# Patient Record
Sex: Female | Born: 1975
Health system: Southern US, Community
[De-identification: ages and names within clinical notes are randomized; demographics above are authoritative.]

## PROBLEM LIST (undated history)

## (undated) DIAGNOSIS — E669 Obesity, unspecified: Secondary | ICD-10-CM

## (undated) DIAGNOSIS — I10 Essential (primary) hypertension: Secondary | ICD-10-CM

## (undated) DIAGNOSIS — O24419 Gestational diabetes mellitus in pregnancy, unspecified control: Secondary | ICD-10-CM

## (undated) DIAGNOSIS — M5030 Other cervical disc degeneration, unspecified cervical region: Secondary | ICD-10-CM

## (undated) DIAGNOSIS — K219 Gastro-esophageal reflux disease without esophagitis: Secondary | ICD-10-CM

## (undated) DIAGNOSIS — I201 Angina pectoris with documented spasm: Secondary | ICD-10-CM

## (undated) DIAGNOSIS — Z6839 Body mass index (BMI) 39.0-39.9, adult: Secondary | ICD-10-CM

## (undated) DIAGNOSIS — R000 Tachycardia, unspecified: Secondary | ICD-10-CM

## (undated) DIAGNOSIS — R002 Palpitations: Secondary | ICD-10-CM

## (undated) DIAGNOSIS — G35D Multiple sclerosis, unspecified: Secondary | ICD-10-CM

## (undated) DIAGNOSIS — G35 Multiple sclerosis: Secondary | ICD-10-CM

## (undated) HISTORY — DX: Essential (primary) hypertension: I10

## (undated) HISTORY — DX: Multiple sclerosis, unspecified: G35.D

## (undated) HISTORY — PX: TUBAL LIGATION: SHX77

## (undated) HISTORY — DX: Other cervical disc degeneration, unspecified cervical region: M50.30

## (undated) HISTORY — DX: Body mass index (BMI) 39.0-39.9, adult: Z68.39

## (undated) HISTORY — DX: Gastro-esophageal reflux disease without esophagitis: K21.9

## (undated) HISTORY — DX: Palpitations: R00.2

## (undated) HISTORY — DX: Angina pectoris with documented spasm: I20.1

## (undated) HISTORY — DX: Multiple sclerosis: G35

## (undated) HISTORY — DX: Gestational diabetes mellitus in pregnancy, unspecified control: O24.419

## (undated) HISTORY — DX: Tachycardia, unspecified: R00.0

## (undated) HISTORY — PX: CHOLECYSTECTOMY: SHX55

---

## 2015-07-01 ENCOUNTER — Emergency Department (HOSPITAL_BASED_OUTPATIENT_CLINIC_OR_DEPARTMENT_OTHER)
Admission: EM | Admit: 2015-07-01 | Discharge: 2015-07-01 | Disposition: A | Discharge status: Home / Self Care | Payer: Commercial Managed Care - PPO | Attending: Emergency Medicine | Admitting: Emergency Medicine

## 2015-07-01 ENCOUNTER — Emergency Department (HOSPITAL_BASED_OUTPATIENT_CLINIC_OR_DEPARTMENT_OTHER): Payer: Commercial Managed Care - PPO

## 2015-07-01 ENCOUNTER — Encounter (HOSPITAL_BASED_OUTPATIENT_CLINIC_OR_DEPARTMENT_OTHER): Payer: Self-pay | Admitting: Emergency Medicine

## 2015-07-01 DIAGNOSIS — J159 Unspecified bacterial pneumonia: Secondary | ICD-10-CM | POA: Insufficient documentation

## 2015-07-01 DIAGNOSIS — J189 Pneumonia, unspecified organism: Secondary | ICD-10-CM

## 2015-07-01 DIAGNOSIS — Z3202 Encounter for pregnancy test, result negative: Secondary | ICD-10-CM | POA: Diagnosis not present

## 2015-07-01 DIAGNOSIS — R05 Cough: Secondary | ICD-10-CM | POA: Diagnosis present

## 2015-07-01 LAB — URINALYSIS, ROUTINE W REFLEX MICROSCOPIC
BILIRUBIN URINE: NEGATIVE
GLUCOSE, UA: NEGATIVE mg/dL
KETONES UR: NEGATIVE mg/dL
LEUKOCYTES UA: NEGATIVE
NITRITE: NEGATIVE
PH: 5.5 (ref 5.0–8.0)
Protein, ur: NEGATIVE mg/dL
SPECIFIC GRAVITY, URINE: 1.03 (ref 1.005–1.030)
Urobilinogen, UA: 0.2 mg/dL (ref 0.0–1.0)

## 2015-07-01 LAB — URINE MICROSCOPIC-ADD ON

## 2015-07-01 LAB — PREGNANCY, URINE: Preg Test, Ur: NEGATIVE

## 2015-07-01 MED ORDER — GUAIFENESIN-DM 100-10 MG/5ML PO SYRP: 5.0000 mL | ORAL_SOLUTION | Freq: Three times a day (TID) | ORAL | Status: DC | PRN

## 2015-07-01 MED ORDER — DOXYCYCLINE HYCLATE 100 MG PO CAPS: 100.0000 mg | ORAL_CAPSULE | Freq: Two times a day (BID) | ORAL | Status: DC

## 2015-07-01 NOTE — ED Notes (Signed)
C/o cough x 5 days, and fever x 1 day. Highest temp 101, took tylenol with relief.

## 2015-07-01 NOTE — ED Provider Notes (Signed)
CSN: [id number]     Arrival date & time 07/01/15  2102 History  By signing my name below, I, Hansel Feinstein, attest that this documentation has been prepared under the direction and in the presence of Davonna Belling, MD. Electronically Signed: Hansel Feinstein, ED Scribe. 07/01/2015. 9:45 PM.    Chief Complaint  Patient presents with  . Cough   The history is provided by the patient. No language interpreter was used.    HPI Comments: Elvi Leventhal is a 39 y.o. female who presents to the Emergency Department complaining of moderate, intermittent productive cough onset 5 weeks worsened today with associated fever onset today. She states that she just moved here 2 years ago from [place] and reports that she had some seasonal allergies last year. No other h/o seasonal allergies or asthma. Pt works in a hospital. No other known sick contact. No recent travel. No concern for pregnancy. She denies leg swelling, sore throat.  History reviewed. No pertinent past medical history. History reviewed. No pertinent past surgical history. No family history on file. Social History  Substance Use Topics  . Smoking status: Never Smoker   . Smokeless tobacco: None  . Alcohol Use: No   OB History    No data available     Review of Systems  Constitutional: Positive for fever.  HENT: Negative for sore throat.   Respiratory: Positive for cough.   Cardiovascular: Negative for leg swelling.   Allergies  Review of patient's allergies indicates no known allergies.  Home Medications   Prior to Admission medications   Medication Sig Start Date End Date Taking? Authorizing Provider  doxycycline (VIBRAMYCIN) 100 MG capsule Take 1 capsule (100 mg total) by mouth 2 (two) times daily. 07/01/15   Davonna Belling, MD  guaiFENesin-dextromethorphan (ROBITUSSIN DM) 100-10 MG/5ML syrup Take 5 mLs by mouth 3 (three) times daily as needed for cough. 07/01/15   Davonna Belling, MD   BP 138/79 mmHg  Pulse 111  Temp(Src)  98.7 F (37.1 C) (Oral)  Resp 20  Ht 5\' 4"  (1.626 m)  Wt 242 lb (109.77 kg)  BMI 41.52 kg/m2  SpO2 100%  LMP 06/22/2015 Physical Exam  Constitutional: She is oriented to person, place, and time. She appears well-developed and well-nourished.  HENT:  Head: Normocephalic and atraumatic.  Mouth/Throat: Oropharyngeal exudate and posterior oropharyngeal erythema present.  Mild post oropharyngeal erythema. There are a few scattered vesicles.   Eyes: Conjunctivae and EOM are normal. Pupils are equal, round, and reactive to light.  Neck: Normal range of motion. Neck supple.  No cervical lymphadenopathy.   Cardiovascular: Normal rate.   Pulmonary/Chest: Effort normal and breath sounds normal. No respiratory distress. She has no wheezes. She has no rales.  Lungs CTA bilaterally.   Abdominal: She exhibits no distension.  Musculoskeletal: Normal range of motion. She exhibits no edema.  No peripheral edema.   Lymphadenopathy:    She has no cervical adenopathy.  Neurological: She is alert and oriented to person, place, and time.  Skin: Skin is warm and dry.  Psychiatric: She has a normal mood and affect. Her behavior is normal.  Nursing note and vitals reviewed.  ED Course  Procedures (including critical care time) DIAGNOSTIC STUDIES: Oxygen Saturation is 99% on RA, normal by my interpretation.    COORDINATION OF CARE: 9:30 PM Discussed treatment plan with pt at bedside and pt agreed to plan.   Labs Review Labs Reviewed  URINALYSIS, ROUTINE W REFLEX MICROSCOPIC (NOT AT [facility]) - Abnormal; Notable for  the following:    Hgb urine dipstick SMALL (*)    All other components within normal limits  URINE MICROSCOPIC-ADD ON - Abnormal; Notable for the following:    Bacteria, UA FEW (*)    All other components within normal limits  PREGNANCY, URINE    Imaging Review No results found. I have personally reviewed and evaluated these images and lab results as part of my medical  decision-making.  MDM   Final diagnoses:  CAP (community acquired pneumonia)  Community acquired pneumonia    Patient with cough. Has had for several weeks. X-ray shows pneumonia. Will treat with antibiotics. Appears stable for discharge I personally performed the services described in this documentation, which was scribed in my presence. The recorded information has been reviewed and is accurate.     Davonna Belling, MD 07/06/15 [phone]

## 2015-07-01 NOTE — Discharge Instructions (Signed)

## 2015-07-01 NOTE — ED Notes (Signed)
Patient understands discharge instructions and medication.

## 2015-12-26 ENCOUNTER — Encounter (HOSPITAL_COMMUNITY): Payer: Self-pay | Admitting: Emergency Medicine

## 2015-12-26 ENCOUNTER — Emergency Department (HOSPITAL_COMMUNITY)
Admission: EM | Admit: 2015-12-26 | Discharge: 2015-12-26 | Disposition: A | Discharge status: Home / Self Care | Payer: Commercial Managed Care - PPO | Attending: Emergency Medicine | Admitting: Emergency Medicine

## 2015-12-26 ENCOUNTER — Emergency Department (HOSPITAL_COMMUNITY): Payer: Commercial Managed Care - PPO

## 2015-12-26 DIAGNOSIS — R42 Dizziness and giddiness: Secondary | ICD-10-CM | POA: Diagnosis present

## 2015-12-26 DIAGNOSIS — R519 Headache, unspecified: Secondary | ICD-10-CM

## 2015-12-26 DIAGNOSIS — H538 Other visual disturbances: Secondary | ICD-10-CM | POA: Diagnosis not present

## 2015-12-26 DIAGNOSIS — Z3202 Encounter for pregnancy test, result negative: Secondary | ICD-10-CM | POA: Insufficient documentation

## 2015-12-26 DIAGNOSIS — R51 Headache: Secondary | ICD-10-CM | POA: Insufficient documentation

## 2015-12-26 LAB — BASIC METABOLIC PANEL
ANION GAP: 10 (ref 5–15)
BUN: 8 mg/dL (ref 6–20)
CHLORIDE: 104 mmol/L (ref 101–111)
CO2: 25 mmol/L (ref 22–32)
CREATININE: 0.65 mg/dL (ref 0.44–1.00)
Calcium: 9.5 mg/dL (ref 8.9–10.3)
GFR calc non Af Amer: >60 mL/min (ref >60)
Glucose, Bld: 128 mg/dL — ABNORMAL HIGH (ref 65–99)
POTASSIUM: 4 mmol/L (ref 3.5–5.1)
SODIUM: 139 mmol/L (ref 135–145)

## 2015-12-26 LAB — I-STAT CHEM 8, ED
BUN: 8 mg/dL (ref 6–20)
BUN: 8 mg/dL (ref 6–20)
CALCIUM ION: 1.16 mmol/L (ref 1.12–1.23)
CREATININE: 0.6 mg/dL (ref 0.44–1.00)
Calcium, Ion: 1.14 mmol/L (ref 1.12–1.23)
Chloride: 102 mmol/L (ref 101–111)
Chloride: 102 mmol/L (ref 101–111)
Creatinine, Ser: 0.6 mg/dL (ref 0.44–1.00)
GLUCOSE: 120 mg/dL — AB (ref 65–99)
GLUCOSE: 122 mg/dL — AB (ref 65–99)
HCT: 42 % (ref 36.0–46.0)
HEMATOCRIT: 41 % (ref 36.0–46.0)
HEMOGLOBIN: 13.9 g/dL (ref 12.0–15.0)
HEMOGLOBIN: 14.3 g/dL (ref 12.0–15.0)
POTASSIUM: 4 mmol/L (ref 3.5–5.1)
Potassium: 4 mmol/L (ref 3.5–5.1)
SODIUM: 141 mmol/L (ref 135–145)
Sodium: 140 mmol/L (ref 135–145)
TCO2: 25 mmol/L (ref 0–100)
TCO2: 25 mmol/L (ref 0–100)

## 2015-12-26 LAB — CBC WITH DIFFERENTIAL/PLATELET
BASOS PCT: 0 %
Basophils Absolute: 0 10*3/uL (ref 0.0–0.1)
EOS ABS: 0.1 10*3/uL (ref 0.0–0.7)
Eosinophils Relative: 1 %
HCT: 39.4 % (ref 36.0–46.0)
HEMOGLOBIN: 13 g/dL (ref 12.0–15.0)
Lymphocytes Relative: 22 %
Lymphs Abs: 1.9 10*3/uL (ref 0.7–4.0)
MCH: 28.1 pg (ref 26.0–34.0)
MCHC: 33 g/dL (ref 30.0–36.0)
MCV: 85.1 fL (ref 78.0–100.0)
Monocytes Absolute: 0.4 10*3/uL (ref 0.1–1.0)
Monocytes Relative: 4 %
Neutro Abs: 6.2 10*3/uL (ref 1.7–7.7)
Neutrophils Relative %: 73 %
Platelets: 218 10*3/uL (ref 150–400)
RBC: 4.63 MIL/uL (ref 3.87–5.11)
RDW: 13.4 % (ref 11.5–15.5)
WBC: 8.6 10*3/uL (ref 4.0–10.5)

## 2015-12-26 LAB — I-STAT BETA HCG BLOOD, ED (MC, WL, AP ONLY)

## 2015-12-26 MED ORDER — METOCLOPRAMIDE HCL 5 MG/ML IJ SOLN
10.0000 mg | Freq: Once | INTRAMUSCULAR | Status: AC
  Administered 2015-12-26: 10 mg via INTRAVENOUS
  Filled 2015-12-26: qty 2

## 2015-12-26 MED ORDER — IOPAMIDOL (ISOVUE-370) INJECTION 76%
INTRAVENOUS | Status: AC
  Filled 2015-12-26: qty 50

## 2015-12-26 MED ORDER — IOPAMIDOL (ISOVUE-370) INJECTION 76%
INTRAVENOUS | Status: AC
  Administered 2015-12-26: 75 mL via INTRAVENOUS
  Filled 2015-12-26: qty 50

## 2015-12-26 MED ORDER — DIPHENHYDRAMINE HCL 50 MG/ML IJ SOLN
25.0000 mg | Freq: Once | INTRAMUSCULAR | Status: AC
  Administered 2015-12-26: 25 mg via INTRAVENOUS
  Filled 2015-12-26: qty 1

## 2015-12-26 MED ORDER — SODIUM CHLORIDE 0.9 % IV BOLUS (SEPSIS)
1000.0000 mL | Freq: Once | INTRAVENOUS | Status: AC
  Administered 2015-12-26: 1000 mL via INTRAVENOUS

## 2015-12-26 MED ORDER — DIPHENHYDRAMINE HCL 50 MG/ML IJ SOLN: 25.0000 mg | Freq: Once | INTRAMUSCULAR | Status: DC

## 2015-12-26 NOTE — ED Notes (Signed)
Pt here from OR with c/o dizziness and nausea , pt states that it started around 1130

## 2015-12-26 NOTE — ED Provider Notes (Signed)
CSN: [id number]     Arrival date & time 12/26/15  1109 History   First MD Initiated Contact with Patient 12/26/15 1147     Chief Complaint  Patient presents with  . Dizziness     (Consider location/radiation/quality/duration/timing/severity/associated sxs/prior Treatment) Patient is a 40 y.o. female presenting with dizziness.  Dizziness Associated symptoms: headaches   Associated symptoms: no chest pain and no weakness    Patient is a 40 year old female who presents complaining of a sudden onset of the headache. She complains of a right-sided headache, but she says is sharp in nature. She says she has had headaches similar to this before, and this is not the most severe headache of her life. She does say, today it is unusual that she has had vertigo, with a sensation of the room spinning, associated with blurry vision. She denies any neck pain. She has had no recent head trauma, fever, or neck pain.  She has no diagnosed history of migraine headaches.   Nothing seems to make it better, nothing seems to make it worse. She currently complains of a 10 out of 10 right-sided, sharp headache.   History reviewed. No pertinent past medical history. Past Surgical History  Procedure Laterality Date  . Cesarean section    . Cholecystectomy     History reviewed. No pertinent family history. Social History  Substance Use Topics  . Smoking status: Never Smoker   . Smokeless tobacco: None  . Alcohol Use: No   OB History    No data available     Review of Systems  Constitutional: Negative for fever, chills and fatigue.  Respiratory: Negative for choking and chest tightness.   Cardiovascular: Negative for chest pain.  Neurological: Positive for dizziness, light-headedness and headaches. Negative for tremors, syncope, facial asymmetry, speech difficulty and weakness.  All other systems reviewed and are negative.     Allergies  Review of patient's allergies indicates no known  allergies.  Home Medications   Prior to Admission medications   Not on File   BP 105/68 mmHg  Pulse 76  Temp(Src) 99 F (37.2 C) (Oral)  Resp 13  SpO2 99%  LMP 12/24/2015 Physical Exam  Constitutional: She is oriented to person, place, and time. She appears well-developed and well-nourished.  HENT:  Head: Normocephalic and atraumatic.  Eyes: Conjunctivae and EOM are normal. Pupils are equal, round, and reactive to light.  Neck: Normal range of motion. No JVD present.  Cardiovascular: Normal rate, regular rhythm and normal heart sounds.   Pulmonary/Chest: Effort normal and breath sounds normal. No respiratory distress.  Abdominal: Soft. She exhibits no distension.  Musculoskeletal: Normal range of motion.  Neurological: She is alert and oriented to person, place, and time. She has normal strength. No cranial nerve deficit or sensory deficit. Coordination and gait normal.  Skin: Skin is warm and dry.    ED Course  Procedures (including critical care time) Labs Review Labs Reviewed  BASIC METABOLIC PANEL - Abnormal; Notable for the following:    Glucose, Bld 128 (*)    All other components within normal limits  I-STAT CHEM 8, ED - Abnormal; Notable for the following:    Glucose, Bld 122 (*)    All other components within normal limits  I-STAT CHEM 8, ED - Abnormal; Notable for the following:    Glucose, Bld 120 (*)    All other components within normal limits  CBC WITH DIFFERENTIAL/PLATELET  I-STAT BETA HCG BLOOD, ED ([facility], [facility], [facility] ONLY)  Imaging Review Ct Angio Head W/cm &/or Wo Cm  12/26/2015  CLINICAL DATA:  40 year old female with sudden onset right frontal headache and dizziness at 1030 hours today. Since then pain has improved but not resolved. Initial encounter. EXAM: CT HEAD WITHOUT CONTRAST CT ANGIOGRAPHY HEAD AND NECK TECHNIQUE: Multidetector CT imaging of the head and neck was performed using the standard protocol during bolus administration of intravenous  contrast. Multiplanar CT image reconstructions and MIPs were obtained to evaluate the vascular anatomy. Carotid stenosis measurements (when applicable) are obtained utilizing NASCET criteria, using the distal internal carotid diameter as the denominator. CONTRAST:  75 mL Isovue 370 COMPARISON:  None. FINDINGS: CT HEAD Brain: No midline shift, ventriculomegaly, mass effect, evidence of mass lesion, intracranial hemorrhage or evidence of cortically based acute infarction. Gray-white matter differentiation is within normal limits throughout the brain. Calvarium and skull base: Negative. Paranasal sinuses: Clear. Orbits: Negative. CTA NECK Skeleton: Age advanced posterior cervical spine degenerative endplate spurring. No acute osseous abnormality identified. Supernumerary left anterior maxillary tooth. Other neck: Negative lung apices. No superior mediastinal lymphadenopathy. Thyroid, larynx (glottis is closed), pharynx, parapharyngeal spaces, retropharyngeal space, sublingual space, submandibular glands and parotid glands are within normal limits. No cervical lymphadenopathy. Aortic arch: 3 vessel arch configuration. No arch atherosclerosis or great vessel origin stenosis. Right carotid system: Negative. Left carotid system: Negative. Vertebral arteries: No proximal subclavian artery stenosis. Normal vertebral artery origins. Negative cervical vertebral arteries, the left is mildly dominant. CTA HEAD Posterior circulation: No distal vertebral artery stenosis. Normal vertebrobasilar junction; mild fenestration (normal variant). Normal right PICA origin. Normal dominant appearing left AICA origin. No basilar stenosis. Normal SCA and PCA origins. The right posterior communicating artery is present, the left is diminutive or absent. Normal bilateral PCA branches. Anterior circulation: Both ICA siphons are patent, the right appears dominant. There is mild left speech wrists segment calcified plaque with no stenosis. Normal  ophthalmic and right posterior communicating artery origins. Normal carotid termini. Normal MCA and ACA origins. Mildly dominant right A1 segment. Anterior communicating artery and bilateral ACA branches are normal. Left MCA M1 segment, bifurcation, and left MCA branches are within normal limits. Right MCA M1 segment, bifurcation, and right MCA branches are within normal limits. Venous sinuses: Patent. Anatomic variants: Mildly dominant left vertebral artery. Mildly dominant right ICA. Small fenestration at the vertebrobasilar junction. Delayed phase: No abnormal enhancement identified. IMPRESSION: 1. Negative CTA head and neck. 2. Normal CT appearance of the brain. No intracranial hemorrhage identified. Electronically Signed   By: Genevie Ann M.D.   On: 12/26/2015 14:13   Ct Head Wo Contrast  12/26/2015  CLINICAL DATA:  40 year old female with sudden onset right frontal headache and dizziness at 1030 hours today. Since then pain has improved but not resolved. Initial encounter. EXAM: CT HEAD WITHOUT CONTRAST CT ANGIOGRAPHY HEAD AND NECK TECHNIQUE: Multidetector CT imaging of the head and neck was performed using the standard protocol during bolus administration of intravenous contrast. Multiplanar CT image reconstructions and MIPs were obtained to evaluate the vascular anatomy. Carotid stenosis measurements (when applicable) are obtained utilizing NASCET criteria, using the distal internal carotid diameter as the denominator. CONTRAST:  75 mL Isovue 370 COMPARISON:  None. FINDINGS: CT HEAD Brain: No midline shift, ventriculomegaly, mass effect, evidence of mass lesion, intracranial hemorrhage or evidence of cortically based acute infarction. Gray-white matter differentiation is within normal limits throughout the brain. Calvarium and skull base: Negative. Paranasal sinuses: Clear. Orbits: Negative. CTA NECK Skeleton: Age advanced posterior cervical spine degenerative  endplate spurring. No acute osseous abnormality  identified. Supernumerary left anterior maxillary tooth. Other neck: Negative lung apices. No superior mediastinal lymphadenopathy. Thyroid, larynx (glottis is closed), pharynx, parapharyngeal spaces, retropharyngeal space, sublingual space, submandibular glands and parotid glands are within normal limits. No cervical lymphadenopathy. Aortic arch: 3 vessel arch configuration. No arch atherosclerosis or great vessel origin stenosis. Right carotid system: Negative. Left carotid system: Negative. Vertebral arteries: No proximal subclavian artery stenosis. Normal vertebral artery origins. Negative cervical vertebral arteries, the left is mildly dominant. CTA HEAD Posterior circulation: No distal vertebral artery stenosis. Normal vertebrobasilar junction; mild fenestration (normal variant). Normal right PICA origin. Normal dominant appearing left AICA origin. No basilar stenosis. Normal SCA and PCA origins. The right posterior communicating artery is present, the left is diminutive or absent. Normal bilateral PCA branches. Anterior circulation: Both ICA siphons are patent, the right appears dominant. There is mild left speech wrists segment calcified plaque with no stenosis. Normal ophthalmic and right posterior communicating artery origins. Normal carotid termini. Normal MCA and ACA origins. Mildly dominant right A1 segment. Anterior communicating artery and bilateral ACA branches are normal. Left MCA M1 segment, bifurcation, and left MCA branches are within normal limits. Right MCA M1 segment, bifurcation, and right MCA branches are within normal limits. Venous sinuses: Patent. Anatomic variants: Mildly dominant left vertebral artery. Mildly dominant right ICA. Small fenestration at the vertebrobasilar junction. Delayed phase: No abnormal enhancement identified. IMPRESSION: 1. Negative CTA head and neck. 2. Normal CT appearance of the brain. No intracranial hemorrhage identified. Electronically Signed   By: Genevie Ann  M.D.   On: 12/26/2015 14:13   Ct Angio Neck W/cm &/or Wo/cm  12/26/2015  CLINICAL DATA:  40 year old female with sudden onset right frontal headache and dizziness at 1030 hours today. Since then pain has improved but not resolved. Initial encounter. EXAM: CT HEAD WITHOUT CONTRAST CT ANGIOGRAPHY HEAD AND NECK TECHNIQUE: Multidetector CT imaging of the head and neck was performed using the standard protocol during bolus administration of intravenous contrast. Multiplanar CT image reconstructions and MIPs were obtained to evaluate the vascular anatomy. Carotid stenosis measurements (when applicable) are obtained utilizing NASCET criteria, using the distal internal carotid diameter as the denominator. CONTRAST:  75 mL Isovue 370 COMPARISON:  None. FINDINGS: CT HEAD Brain: No midline shift, ventriculomegaly, mass effect, evidence of mass lesion, intracranial hemorrhage or evidence of cortically based acute infarction. Gray-white matter differentiation is within normal limits throughout the brain. Calvarium and skull base: Negative. Paranasal sinuses: Clear. Orbits: Negative. CTA NECK Skeleton: Age advanced posterior cervical spine degenerative endplate spurring. No acute osseous abnormality identified. Supernumerary left anterior maxillary tooth. Other neck: Negative lung apices. No superior mediastinal lymphadenopathy. Thyroid, larynx (glottis is closed), pharynx, parapharyngeal spaces, retropharyngeal space, sublingual space, submandibular glands and parotid glands are within normal limits. No cervical lymphadenopathy. Aortic arch: 3 vessel arch configuration. No arch atherosclerosis or great vessel origin stenosis. Right carotid system: Negative. Left carotid system: Negative. Vertebral arteries: No proximal subclavian artery stenosis. Normal vertebral artery origins. Negative cervical vertebral arteries, the left is mildly dominant. CTA HEAD Posterior circulation: No distal vertebral artery stenosis. Normal  vertebrobasilar junction; mild fenestration (normal variant). Normal right PICA origin. Normal dominant appearing left AICA origin. No basilar stenosis. Normal SCA and PCA origins. The right posterior communicating artery is present, the left is diminutive or absent. Normal bilateral PCA branches. Anterior circulation: Both ICA siphons are patent, the right appears dominant. There is mild left speech wrists segment calcified plaque with no  stenosis. Normal ophthalmic and right posterior communicating artery origins. Normal carotid termini. Normal MCA and ACA origins. Mildly dominant right A1 segment. Anterior communicating artery and bilateral ACA branches are normal. Left MCA M1 segment, bifurcation, and left MCA branches are within normal limits. Right MCA M1 segment, bifurcation, and right MCA branches are within normal limits. Venous sinuses: Patent. Anatomic variants: Mildly dominant left vertebral artery. Mildly dominant right ICA. Small fenestration at the vertebrobasilar junction. Delayed phase: No abnormal enhancement identified. IMPRESSION: 1. Negative CTA head and neck. 2. Normal CT appearance of the brain. No intracranial hemorrhage identified. Electronically Signed   By: Genevie Ann M.D.   On: 12/26/2015 14:13   I have personally reviewed and evaluated these images and lab results as part of my medical decision-making.   EKG Interpretation   Date/Time:  Monday December 26 2015 11:49:51 EDT Ventricular Rate:  83 PR Interval:  163 QRS Duration: 84 QT Interval:  348 QTC Calculation: 409 R Axis:   71 Text Interpretation:  Sinus rhythm No previous ECGs available Confirmed by  YAO  MD, DAVID ([id number]) on 12/26/2015 11:56:49 AM      MDM   Final diagnoses:  Acute nonintractable headache, unspecified headache type    Patient presents with acute onset of headache. CT angiogram of the head and neck obtained to rule out subarachnoid hemorrhage or dissection, considering the sudden onset with vertigo,  unilateral. She has a normal neurologic exam, and CT does not show any evidence of aneurysm, subarachnoid hemorrhage, or vertebral artery dissection. Headache and vertigo completely resolved with treatment of her headache.  Patient was able to ambulate in the halls with a normal gait, had complete resolution of her symptoms with no residual headache or vertigo. Not consistent with central cause.  Leata Mouse, MD 12/26/15 [address] Yao, MD 12/27/15 [phone]

## 2015-12-26 NOTE — Discharge Instructions (Signed)

## 2016-08-15 ENCOUNTER — Ambulatory Visit (HOSPITAL_COMMUNITY)
Admission: EM | Admit: 2016-08-15 | Discharge: 2016-08-15 | Disposition: A | Discharge status: Home / Self Care | Payer: 59 | Attending: Family Medicine | Admitting: Family Medicine

## 2016-08-15 ENCOUNTER — Encounter (HOSPITAL_COMMUNITY): Payer: Self-pay | Admitting: Emergency Medicine

## 2016-08-15 DIAGNOSIS — J40 Bronchitis, not specified as acute or chronic: Secondary | ICD-10-CM | POA: Diagnosis not present

## 2016-08-15 MED ORDER — HYDROCOD POLST-CPM POLST ER 10-8 MG/5ML PO SUER: 5.0000 mL | Freq: Every evening | ORAL | 0 refills | Status: DC | PRN

## 2016-08-15 MED ORDER — ALBUTEROL SULFATE HFA 108 (90 BASE) MCG/ACT IN AERS: 2.0000 | INHALATION_SPRAY | RESPIRATORY_TRACT | 1 refills | Status: DC | PRN

## 2016-08-15 MED ORDER — AZITHROMYCIN 250 MG PO TABS: 250.0000 mg | ORAL_TABLET | Freq: Every day | ORAL | 0 refills | Status: DC

## 2016-08-15 MED FILL — AZITHROMYCIN 250 MG TABLET: 250 | 5 days supply | Qty: 6 | Fill #0

## 2016-08-15 MED FILL — HYDROCODONE-CHLORPHENIRAM S: 10-8 | 28 days supply | Qty: 140 | Fill #0

## 2016-08-15 MED FILL — VENTOLIN HFA 90 MCG INHALER: 108 (90 BAS | 25 days supply | Qty: 18 | Fill #0

## 2016-08-15 NOTE — ED Triage Notes (Signed)
The patient presented to the [facility] with a complaint of a cough with a low grade fever and chest wall pain x 1 week. The patient reported using OTC meds with minimal results.

## 2016-08-15 NOTE — ED Provider Notes (Signed)
[facility]    CSN: [id number] Arrival date & time: 08/15/16  1541     History   Chief Complaint Chief Complaint  Patient presents with  . Cough    HPI Martha Nelson is a 40 y.o. female.   Is a 40 year old woman with cough and chest pain for a week. She's had this kind of cough before that went on for 5 weeks and developed into pneumonia.  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 She does not smoke and has no history of asthma.      History reviewed. No pertinent past medical history.  There are no active problems to display for this patient.   Past Surgical History:  Procedure Laterality Date  . CESAREAN SECTION    . CHOLECYSTECTOMY      OB History    No data available       Home Medications    Prior to Admission medications   Medication Sig Start Date End Date Taking? Authorizing Provider  albuterol (PROVENTIL HFA;VENTOLIN HFA) 108 (90 Base) MCG/ACT inhaler Inhale 2 puffs into the lungs every 4 (four) hours as needed for wheezing or shortness of breath (cough, shortness of breath or wheezing.). 08/15/16   Robyn Haber, MD  azithromycin (ZITHROMAX) 250 MG tablet Take 1 tablet (250 mg total) by mouth daily. Take first 2 tablets together, then 1 every day until finished. 08/15/16   Robyn Haber, MD  chlorpheniramine-HYDROcodone [facility] ER) 10-8 MG/5ML SUER Take 5 mLs by mouth at bedtime as needed for cough. 08/15/16   Robyn Haber, MD    Family History History reviewed. No pertinent family history.  Social History Social History  Substance Use Topics  . Smoking status: Never Smoker  . Smokeless tobacco: Not on file  . Alcohol use No     Allergies   Patient has no known allergies.   Review of Systems Review of Systems  Constitutional: Negative.   HENT: Negative.   Respiratory: Positive for cough and wheezing. Negative for shortness of breath.   Gastrointestinal: Negative.      Physical Exam Triage  Vital Signs ED Triage Vitals  Enc Vitals Group     BP 08/15/16 1602 128/76     Pulse Rate 08/15/16 1602 102     Resp 08/15/16 1602 18     Temp 08/15/16 1602 99.1 F (37.3 C)     Temp Source 08/15/16 1602 Oral     SpO2 08/15/16 1602 99 %     Weight --      Height --      Head Circumference --      Peak Flow --      Pain Score 08/15/16 1607 5     Pain Loc --      Pain Edu? --      Excl. in [place]? --    No data found.   Updated Vital Signs BP 128/76 (BP Location: Left Arm)   Pulse 102   Temp 99.1 F (37.3 C) (Oral)   Resp 18   LMP 08/01/2016 (Exact Date)   SpO2 99%    Physical Exam  Constitutional: She is oriented to person, place, and time. She appears well-developed and well-nourished.  HENT:  Head: Normocephalic.  Right Ear: External ear normal.  Left Ear: External ear normal.  Mouth/Throat: Oropharynx is clear and moist.  Eyes: Conjunctivae and EOM are normal.  Neck: Normal range of motion. Neck supple.  Cardiovascular: Normal rate, regular rhythm and normal  heart sounds.   Pulmonary/Chest: Effort normal. She has wheezes.  Musculoskeletal: Normal range of motion.  Lymphadenopathy:    She has no cervical adenopathy.  Neurological: She is alert and oriented to person, place, and time.  Skin: Skin is warm and dry.  Nursing note and vitals reviewed.    UC Treatments / Results  Labs (all labs ordered are listed, but only abnormal results are displayed) Labs Reviewed - No data to display  EKG  EKG Interpretation None       Radiology No results found.  Procedures Procedures (including critical care time)  Medications Ordered in UC Medications - No data to display   Initial Impression / Assessment and Plan / UC Course  I have reviewed the triage vital signs and the nursing notes.  Pertinent labs & imaging results that were available during my care of the patient were reviewed by me and considered in my medical decision making (see chart for  details).  Clinical Course     Final Clinical Impressions(s) / UC Diagnoses   Final diagnoses:  Bronchitis    New Prescriptions New Prescriptions   ALBUTEROL (PROVENTIL HFA;VENTOLIN HFA) 108 (90 BASE) MCG/ACT INHALER    Inhale 2 puffs into the lungs every 4 (four) hours as needed for wheezing or shortness of breath (cough, shortness of breath or wheezing.).   AZITHROMYCIN (ZITHROMAX) 250 MG TABLET    Take 1 tablet (250 mg total) by mouth daily. Take first 2 tablets together, then 1 every day until finished.   CHLORPHENIRAMINE-HYDROCODONE (TUSSIONEX PENNKINETIC ER) 10-8 MG/5ML SUER    Take 5 mLs by mouth at bedtime as needed for cough.     Robyn Haber, MD 08/15/16 1626

## 2016-10-16 DIAGNOSIS — Z0000 Encounter for general adult medical examination without abnormal findings: Secondary | ICD-10-CM | POA: Diagnosis not present

## 2016-11-13 DIAGNOSIS — R740 Nonspecific elevation of levels of transaminase and lactic acid dehydrogenase [LDH]: Secondary | ICD-10-CM | POA: Diagnosis not present

## 2016-11-27 DIAGNOSIS — Z01419 Encounter for gynecological examination (general) (routine) without abnormal findings: Secondary | ICD-10-CM | POA: Diagnosis not present

## 2016-11-27 DIAGNOSIS — Z6837 Body mass index (BMI) 37.0-37.9, adult: Secondary | ICD-10-CM | POA: Diagnosis not present

## 2017-04-05 DIAGNOSIS — L821 Other seborrheic keratosis: Secondary | ICD-10-CM | POA: Diagnosis not present

## 2017-04-05 DIAGNOSIS — L218 Other seborrheic dermatitis: Secondary | ICD-10-CM | POA: Diagnosis not present

## 2017-04-05 DIAGNOSIS — D225 Melanocytic nevi of trunk: Secondary | ICD-10-CM | POA: Diagnosis not present

## 2017-04-05 DIAGNOSIS — D1801 Hemangioma of skin and subcutaneous tissue: Secondary | ICD-10-CM | POA: Diagnosis not present

## 2017-04-16 MED FILL — KETOCONAZOLE 2% SHAMPOO: 2 | 30 days supply | Qty: 120 | Fill #0

## 2017-05-29 ENCOUNTER — Emergency Department (HOSPITAL_COMMUNITY): Payer: 59

## 2017-05-29 ENCOUNTER — Observation Stay (HOSPITAL_COMMUNITY)
Admission: EM | Admit: 2017-05-29 | Discharge: 2017-05-30 | Disposition: A | Discharge status: Home / Self Care | Payer: 59 | Attending: Cardiology | Admitting: Cardiology

## 2017-05-29 ENCOUNTER — Encounter (HOSPITAL_COMMUNITY): Payer: Self-pay | Admitting: *Deleted

## 2017-05-29 ENCOUNTER — Other Ambulatory Visit: Payer: Self-pay

## 2017-05-29 DIAGNOSIS — R079 Chest pain, unspecified: Secondary | ICD-10-CM | POA: Diagnosis not present

## 2017-05-29 DIAGNOSIS — I214 Non-ST elevation (NSTEMI) myocardial infarction: Secondary | ICD-10-CM | POA: Diagnosis not present

## 2017-05-29 DIAGNOSIS — E669 Obesity, unspecified: Secondary | ICD-10-CM | POA: Diagnosis not present

## 2017-05-29 DIAGNOSIS — Z6841 Body Mass Index (BMI) 40.0 and over, adult: Secondary | ICD-10-CM | POA: Insufficient documentation

## 2017-05-29 HISTORY — DX: Obesity, unspecified: E66.9

## 2017-05-29 LAB — BASIC METABOLIC PANEL
Anion gap: 13 (ref 5–15)
BUN: 9 mg/dL (ref 6–20)
CALCIUM: 9.6 mg/dL (ref 8.9–10.3)
CO2: 22 mmol/L (ref 22–32)
CREATININE: 0.55 mg/dL (ref 0.44–1.00)
Chloride: 102 mmol/L (ref 101–111)
GFR calc Af Amer: >60 mL/min (ref >60)
Glucose, Bld: 94 mg/dL (ref 65–99)
Potassium: 3.5 mmol/L (ref 3.5–5.1)
SODIUM: 137 mmol/L (ref 135–145)

## 2017-05-29 LAB — I-STAT TROPONIN, ED
Troponin i, poc: 0.01 ng/mL (ref 0.00–0.08)
Troponin i, poc: 0.13 ng/mL (ref 0.00–0.08)

## 2017-05-29 LAB — CBC
HCT: 38.9 % (ref 36.0–46.0)
Hemoglobin: 13.1 g/dL (ref 12.0–15.0)
MCH: 29.4 pg (ref 26.0–34.0)
MCHC: 33.7 g/dL (ref 30.0–36.0)
MCV: 87.2 fL (ref 78.0–100.0)
PLATELETS: 187 10*3/uL (ref 150–400)
RBC: 4.46 MIL/uL (ref 3.87–5.11)
RDW: 12.9 % (ref 11.5–15.5)
WBC: 8.3 10*3/uL (ref 4.0–10.5)

## 2017-05-29 LAB — I-STAT BETA HCG BLOOD, ED (MC, WL, AP ONLY)

## 2017-05-29 MED ORDER — IOPAMIDOL (ISOVUE-370) INJECTION 76%
INTRAVENOUS | Status: AC
  Administered 2017-05-30: 100 mL
  Filled 2017-05-29: qty 100

## 2017-05-29 MED ORDER — ASPIRIN 81 MG PO CHEW
324.0000 mg | CHEWABLE_TABLET | Freq: Once | ORAL | Status: AC
  Administered 2017-05-29: 324 mg via ORAL
  Filled 2017-05-29: qty 4

## 2017-05-29 NOTE — ED Notes (Signed)
ED Provider at bedside. 

## 2017-05-29 NOTE — ED Triage Notes (Signed)
Pt reports onset this afternoon of squeezing pain to her chest with mild nausea. Denies any sob, recent cough or swelling. ekg done at triage and no acute distress is noted.

## 2017-05-29 NOTE — ED Provider Notes (Signed)
[facility] DEPT Provider Note   CSN: [id number] Arrival date & time: 05/29/17  1612     History   Chief Complaint Chief Complaint  Patient presents with  . Chest Pain    HPI Martha Nelson is a 41 y.o. female.  HPI   Patient's a 41 year old with history of obesity. Not on any estrogens. No prolonged travel. No immobilization. Presenting with chest pain starting at 1 PM. She feels some constant nature and feels squeezing. No shortness of breath. Pain does not go into jaw or arm. No diaphoresis. No nausea.  Patient is PERC negative presenting with atypical chest pain.  Past Medical History:  Diagnosis Date  . Obesity     There are no active problems to display for this patient.   Past Surgical History:  Procedure Laterality Date  . CESAREAN SECTION    . CHOLECYSTECTOMY      OB History    No data available       Home Medications    Prior to Admission medications   Medication Sig Start Date End Date Taking? Authorizing Provider  albuterol (PROVENTIL HFA;VENTOLIN HFA) 108 (90 Base) MCG/ACT inhaler Inhale 2 puffs into the lungs every 4 (four) hours as needed for wheezing or shortness of breath (cough, shortness of breath or wheezing.). 08/15/16   Robyn Haber, MD  azithromycin (ZITHROMAX) 250 MG tablet Take 1 tablet (250 mg total) by mouth daily. Take first 2 tablets together, then 1 every day until finished. 08/15/16   Robyn Haber, MD  chlorpheniramine-HYDROcodone (TUSSIONEX PENNKINETIC ER) 10-8 MG/5ML SUER Take 5 mLs by mouth at bedtime as needed for cough. 08/15/16   Robyn Haber, MD    Family History History reviewed. No pertinent family history.  Social History Social History  Substance Use Topics  . Smoking status: Never Smoker  . Smokeless tobacco: Not on file  . Alcohol use No     Allergies   Patient has no known allergies.   Review of Systems Review of Systems  Constitutional: Negative for activity change and fever.  Respiratory:  Positive for chest tightness. Negative for shortness of breath.   Cardiovascular: Positive for chest pain.  Gastrointestinal: Negative for abdominal pain.  All other systems reviewed and are negative.    Physical Exam Updated Vital Signs BP (!) 115/53   Pulse 87   Temp 98.7 F (37.1 C) (Oral)   Resp 16   SpO2 100%   Physical Exam  Constitutional: She is oriented to person, place, and time. She appears well-developed and well-nourished.  HENT:  Head: Normocephalic and atraumatic.  Eyes: Right eye exhibits no discharge.  Cardiovascular: Normal rate and regular rhythm.  Exam reveals no friction rub.   No murmur heard. Pulmonary/Chest: Effort normal and breath sounds normal. No respiratory distress. She has no wheezes.  Abdominal: Soft. She exhibits no distension. There is no tenderness.  Neurological: She is oriented to person, place, and time.  Skin: Skin is warm and dry. She is not diaphoretic.  Psychiatric: She has a normal mood and affect.  Nursing note and vitals reviewed.    ED Treatments / Results  Labs (all labs ordered are listed, but only abnormal results are displayed) Labs Reviewed  BASIC METABOLIC PANEL  CBC  I-STAT TROPONIN, ED  I-STAT BETA HCG BLOOD, ED ([facility], [facility], [facility] ONLY)  I-STAT TROPONIN, ED    EKG  EKG Interpretation  Date/Time:  Wednesday May 29 2017 16:38:34 EDT Ventricular Rate:  88 PR Interval:  148 QRS Duration: 74  QT Interval:  358 QTC Calculation: 433 R Axis:   84 Text Interpretation:  Normal sinus rhythm Normal ECG agree. Reconfirmed by Zenovia Jarred [id number]) on 05/29/2017 10:07:46 PM       Radiology Dg Chest 2 View  Result Date: 05/29/2017 CLINICAL DATA:  Acute left chest pain radiating to the left shoulder EXAM: CHEST  2 VIEW COMPARISON:  07/01/2015 FINDINGS: The heart size and mediastinal contours are within normal limits. Both lungs are clear. The visualized skeletal structures are unremarkable. IMPRESSION: No active  cardiopulmonary disease. Electronically Signed   By: Jerilynn Mages.  Shick M.D.   On: 05/29/2017 18:07    Procedures Procedures (including critical care time)  Medications Ordered in ED Medications - No data to display   Initial Impression / Assessment and Plan / ED Course  I have reviewed the triage vital signs and the nursing notes.  Pertinent labs & imaging results that were available during my care of the patient were reviewed by me and considered in my medical decision making (see chart for details).     Patient's a 41 year old with history of obesity. Not on any estrogens. No prolonged travel. No immobilization. Presenting with chest pain starting at 1 PM. She feels some constant nature and feels squeezing. No shortness of breath. Pain does not go into jaw or arm. No diaphoresis. No nausea.  Patient is PERC negative presenting with atypical chest pain.  10:26 PM Repeat EKG is nonischemic. I-STAT troponin else sufficient given her low heart score. We will have her follow-up with her primary care physician as an outpatient.  11:00 PM New istat is +. Will do Ct angio given higher likelihood of strain by PE than purely ischmic.  Could also consider coronoary vasospams vs takusbos (no inciting event) or coronary dissection.  Discussed with cards and will admit.   Final Clinical Impressions(s) / ED Diagnoses   Final diagnoses:  None    New Prescriptions New Prescriptions   No medications on file     Macarthur Critchley, MD 05/30/17 2030

## 2017-05-30 ENCOUNTER — Encounter (HOSPITAL_COMMUNITY)
Admission: EM | Disposition: A | Discharge status: Home / Self Care | Payer: Self-pay | Source: Home / Self Care | Attending: Physician Assistant

## 2017-05-30 ENCOUNTER — Other Ambulatory Visit (HOSPITAL_COMMUNITY): Payer: 59

## 2017-05-30 ENCOUNTER — Emergency Department (HOSPITAL_COMMUNITY): Payer: 59

## 2017-05-30 DIAGNOSIS — I214 Non-ST elevation (NSTEMI) myocardial infarction: Secondary | ICD-10-CM | POA: Diagnosis not present

## 2017-05-30 DIAGNOSIS — Z6841 Body Mass Index (BMI) 40.0 and over, adult: Secondary | ICD-10-CM | POA: Diagnosis not present

## 2017-05-30 DIAGNOSIS — E669 Obesity, unspecified: Secondary | ICD-10-CM | POA: Diagnosis not present

## 2017-05-30 DIAGNOSIS — R079 Chest pain, unspecified: Secondary | ICD-10-CM | POA: Diagnosis not present

## 2017-05-30 HISTORY — PX: LEFT HEART CATH AND CORONARY ANGIOGRAPHY: CATH118249

## 2017-05-30 HISTORY — DX: Non-ST elevation (NSTEMI) myocardial infarction: I21.4

## 2017-05-30 LAB — TROPONIN I
Troponin I: <0.03 ng/mL (ref <0.03)
Troponin I: <0.03 ng/mL (ref <0.03)

## 2017-05-30 LAB — CBC
HCT: 37.6 % (ref 36.0–46.0)
Hemoglobin: 12.4 g/dL (ref 12.0–15.0)
MCH: 28.8 pg (ref 26.0–34.0)
MCHC: 33 g/dL (ref 30.0–36.0)
MCV: 87.4 fL (ref 78.0–100.0)
Platelets: 182 10*3/uL (ref 150–400)
RBC: 4.3 MIL/uL (ref 3.87–5.11)
RDW: 13.1 % (ref 11.5–15.5)
WBC: 9.5 10*3/uL (ref 4.0–10.5)

## 2017-05-30 LAB — LIPID PANEL
Cholesterol: 160 mg/dL (ref 0–200)
HDL: 50 mg/dL (ref >40)
LDL Cholesterol: 100 mg/dL — ABNORMAL HIGH (ref 0–99)
Total CHOL/HDL Ratio: 3.2 RATIO
Triglycerides: 51 mg/dL (ref <150)
VLDL: 10 mg/dL (ref 0–40)

## 2017-05-30 LAB — HEMOGLOBIN A1C
Hgb A1c MFr Bld: 4.9 % (ref 4.8–5.6)
Mean Plasma Glucose: 93.93 mg/dL

## 2017-05-30 LAB — BASIC METABOLIC PANEL
Anion gap: 8 (ref 5–15)
BUN: 10 mg/dL (ref 6–20)
CO2: 28 mmol/L (ref 22–32)
Calcium: 9 mg/dL (ref 8.9–10.3)
Chloride: 102 mmol/L (ref 101–111)
Creatinine, Ser: 0.73 mg/dL (ref 0.44–1.00)
GFR calc Af Amer: >60 mL/min (ref >60)
GFR calc non Af Amer: >60 mL/min (ref >60)
Glucose, Bld: 96 mg/dL (ref 65–99)
Potassium: 3.8 mmol/L (ref 3.5–5.1)
Sodium: 138 mmol/L (ref 135–145)

## 2017-05-30 LAB — PROTIME-INR
INR: 0.97
INR: 1
PROTHROMBIN TIME: 12.7 s (ref 11.4–15.2)
Prothrombin Time: 13.1 seconds (ref 11.4–15.2)

## 2017-05-30 LAB — HEPARIN LEVEL (UNFRACTIONATED): Heparin Unfractionated: 0.2 IU/mL — ABNORMAL LOW (ref 0.30–0.70)

## 2017-05-30 SURGERY — LEFT HEART CATH AND CORONARY ANGIOGRAPHY
Anesthesia: LOCAL

## 2017-05-30 MED ORDER — SODIUM CHLORIDE 0.9% FLUSH: 3.0000 mL | INTRAVENOUS | Status: DC | PRN

## 2017-05-30 MED ORDER — ISOSORBIDE MONONITRATE ER 30 MG PO TB24
30.0000 mg | ORAL_TABLET | Freq: Every day | ORAL | Status: DC
  Administered 2017-05-30: 30 mg via ORAL
  Filled 2017-05-30: qty 1

## 2017-05-30 MED ORDER — IOPAMIDOL (ISOVUE-370) INJECTION 76%
INTRAVENOUS | Status: DC | PRN
  Administered 2017-05-30: 80 mL via INTRA_ARTERIAL

## 2017-05-30 MED ORDER — HEPARIN BOLUS VIA INFUSION
4000.0000 [IU] | Freq: Once | INTRAVENOUS | Status: AC
  Administered 2017-05-30: 4000 [IU] via INTRAVENOUS
  Filled 2017-05-30: qty 4000

## 2017-05-30 MED ORDER — HEPARIN BOLUS VIA INFUSION
1500.0000 [IU] | Freq: Once | INTRAVENOUS | Status: AC
  Administered 2017-05-30: 1500 [IU] via INTRAVENOUS
  Filled 2017-05-30: qty 1500

## 2017-05-30 MED ORDER — HEPARIN (PORCINE) IN NACL 100-0.45 UNIT/ML-% IJ SOLN
1200.0000 [IU]/h | INTRAMUSCULAR | Status: DC
  Administered 2017-05-30: 1000 [IU]/h via INTRAVENOUS
  Filled 2017-05-30 (×2): qty 250

## 2017-05-30 MED ORDER — MIDAZOLAM HCL 2 MG/2ML IJ SOLN
INTRAMUSCULAR | Status: DC | PRN
  Administered 2017-05-30 (×2): 1 mg via INTRAVENOUS

## 2017-05-30 MED ORDER — NITROGLYCERIN 0.4 MG SL SUBL
0.4000 mg | SUBLINGUAL_TABLET | SUBLINGUAL | Status: DC | PRN
  Administered 2017-05-30 (×3): 0.4 mg via SUBLINGUAL
  Filled 2017-05-30 (×2): qty 1

## 2017-05-30 MED ORDER — SODIUM CHLORIDE 0.9 % IV SOLN: 250.0000 mL | INTRAVENOUS | Status: DC | PRN

## 2017-05-30 MED ORDER — ASPIRIN EC 81 MG PO TBEC: 81.0000 mg | DELAYED_RELEASE_TABLET | Freq: Every day | ORAL | Status: DC

## 2017-05-30 MED ORDER — HEPARIN SODIUM (PORCINE) 1000 UNIT/ML IJ SOLN
INTRAMUSCULAR | Status: AC
  Filled 2017-05-30: qty 1

## 2017-05-30 MED ORDER — ASPIRIN 81 MG PO CHEW
81.0000 mg | CHEWABLE_TABLET | ORAL | Status: AC
  Administered 2017-05-30: 81 mg via ORAL
  Filled 2017-05-30: qty 1

## 2017-05-30 MED ORDER — ISOSORBIDE MONONITRATE ER 30 MG PO TB24: 30.0000 mg | ORAL_TABLET | Freq: Every day | ORAL | 2 refills | Status: DC

## 2017-05-30 MED ORDER — SODIUM CHLORIDE 0.9% FLUSH: 3.0000 mL | Freq: Two times a day (BID) | INTRAVENOUS | Status: DC

## 2017-05-30 MED ORDER — VERAPAMIL HCL 2.5 MG/ML IV SOLN
INTRAVENOUS | Status: AC
  Filled 2017-05-30: qty 2

## 2017-05-30 MED ORDER — HEPARIN (PORCINE) IN NACL 2-0.9 UNIT/ML-% IJ SOLN
INTRAMUSCULAR | Status: AC
Start: 2017-05-30 — End: ?
  Filled 2017-05-30: qty 1000

## 2017-05-30 MED ORDER — ISOSORBIDE MONONITRATE ER 30 MG PO TB24: 30.0000 mg | ORAL_TABLET | Freq: Every day | ORAL | 5 refills | Status: DC

## 2017-05-30 MED ORDER — FENTANYL CITRATE (PF) 100 MCG/2ML IJ SOLN
INTRAMUSCULAR | Status: AC
  Filled 2017-05-30: qty 2

## 2017-05-30 MED ORDER — ACETAMINOPHEN 325 MG PO TABS: 650.0000 mg | ORAL_TABLET | Freq: Four times a day (QID) | ORAL | Status: DC | PRN

## 2017-05-30 MED ORDER — LIDOCAINE HCL (PF) 1 % IJ SOLN
INTRAMUSCULAR | Status: DC | PRN
  Administered 2017-05-30: 20 mL

## 2017-05-30 MED ORDER — HEPARIN (PORCINE) IN NACL 2-0.9 UNIT/ML-% IJ SOLN
INTRAMUSCULAR | Status: AC | PRN
  Administered 2017-05-30: 1000 mL

## 2017-05-30 MED ORDER — IOPAMIDOL (ISOVUE-370) INJECTION 76%
INTRAVENOUS | Status: AC
  Filled 2017-05-30: qty 100

## 2017-05-30 MED ORDER — ONDANSETRON HCL 4 MG/2ML IJ SOLN: 4.0000 mg | Freq: Four times a day (QID) | INTRAMUSCULAR | Status: DC | PRN

## 2017-05-30 MED ORDER — SODIUM CHLORIDE 0.9 % IV SOLN: INTRAVENOUS | Status: AC

## 2017-05-30 MED ORDER — MIDAZOLAM HCL 2 MG/2ML IJ SOLN
INTRAMUSCULAR | Status: AC
  Filled 2017-05-30: qty 2

## 2017-05-30 MED ORDER — FENTANYL CITRATE (PF) 100 MCG/2ML IJ SOLN
INTRAMUSCULAR | Status: DC | PRN
  Administered 2017-05-30: 50 ug via INTRAVENOUS
  Administered 2017-05-30: 25 ug via INTRAVENOUS

## 2017-05-30 MED FILL — ISOSORBIDE MN ER 30 MG TAB: 30 | 30 days supply | Qty: 30 | Fill #0

## 2017-05-30 SURGICAL SUPPLY — 14 items
CATH INFINITI 5FR MULTPACK ANG (CATHETERS) ×3 IMPLANT
COVER PRB 48X5XTLSCP FOLD TPE (BAG) ×2 IMPLANT
COVER PROBE 5X48 (BAG) ×1
GLIDESHEATH SLEND SS 6F .021 (SHEATH) IMPLANT
GUIDEWIRE INQWIRE 1.5J.035X260 (WIRE) ×2 IMPLANT
INQWIRE 1.5J .035X260CM (WIRE) ×3
KIT HEART LEFT (KITS) ×3 IMPLANT
KIT MICROINTRODUCER STIFF 5F (SHEATH) ×3 IMPLANT
PACK CARDIAC CATHETERIZATION (CUSTOM PROCEDURE TRAY) ×3 IMPLANT
SHEATH PINNACLE 5F 10CM (SHEATH) ×3 IMPLANT
SYR MEDRAD MARK V 150ML (SYRINGE) ×3 IMPLANT
TRANSDUCER W/STOPCOCK (MISCELLANEOUS) ×3 IMPLANT
TUBING CIL FLEX 10 FLL-RA (TUBING) IMPLANT
WIRE EMERALD 3MM-J .035X150CM (WIRE) ×3 IMPLANT

## 2017-05-30 NOTE — Progress Notes (Signed)
Progress Note  Patient Name: Martha Nelson Date of Encounter: 05/30/2017  Primary Cardiologist: [place]  Subjective   Feels better. States pain got better once she received Ntg. No relief with NSAIDs or Antacids. Still having 1/10 pain.   Inpatient Medications    Scheduled Meds: . [START ON 05/31/2017] aspirin EC  81 mg Oral Daily   Continuous Infusions: . heparin 1,000 Units/hr (05/30/17 0216)   PRN Meds: acetaminophen, nitroGLYCERIN, ondansetron (ZOFRAN) IV   Vital Signs    Vitals:   05/30/17 0500 05/30/17 0600 05/30/17 0700 05/30/17 0802  BP: 103/63 (!) 96/59 101/68 113/84  Pulse: 72 64 85 78  Resp: 15 14 16 16   Temp:    98.1 F (36.7 C)  TempSrc:    Oral  SpO2: 94% 93% 96% 96%  Weight:      Height:       No intake or output data in the 24 hours ending 05/30/17 0851 Filed Weights   05/30/17 0100  Weight: 242 lb (109.8 kg)    Telemetry    NSR - Personally Reviewed  ECG    Pending today- Personally Reviewed  Physical Exam   GEN: No acute distress.   Neck: No JVD Cardiac: RRR, no murmurs, rubs, or gallops.  Respiratory: Clear to auscultation bilaterally. GI: Soft, nontender, non-distended  MS: No edema; No deformity. Neuro:  Nonfocal  Psych: Normal affect   Labs    Chemistry Recent Labs Lab 05/29/17 1650 05/30/17 0315  NA 137 138  K 3.5 3.8  CL 102 102  CO2 22 28  GLUCOSE 94 96  BUN 9 10  CREATININE 0.55 0.73  CALCIUM 9.6 9.0  GFRNONAA >60 >60  GFRAA >60 >60  ANIONGAP 13 8     Hematology Recent Labs Lab 05/29/17 1650 05/30/17 0315  WBC 8.3 9.5  RBC 4.46 4.30  HGB 13.1 12.4  HCT 38.9 37.6  MCV 87.2 87.4  MCH 29.4 28.8  MCHC 33.7 33.0  RDW 12.9 13.1  PLT 187 182    Cardiac Enzymes Recent Labs Lab 05/29/17 0049 05/30/17 0205  TROPONINI <0.03 <0.03    Recent Labs Lab 05/29/17 1746 05/29/17 2245  TROPIPOC 0.01 0.13*     BNPNo results for input(s): BNP, PROBNP in the last 168 hours.   DDimer No results for  input(s): DDIMER in the last 168 hours.   Radiology    Dg Chest 2 View  Result Date: 05/29/2017 CLINICAL DATA:  Acute left chest pain radiating to the left shoulder EXAM: CHEST  2 VIEW COMPARISON:  07/01/2015 FINDINGS: The heart size and mediastinal contours are within normal limits. Both lungs are clear. The visualized skeletal structures are unremarkable. IMPRESSION: No active cardiopulmonary disease. Electronically Signed   By: Jerilynn Mages.  Shick M.D.   On: 05/29/2017 18:07   Ct Angio Chest Pe W And/or Wo Contrast  Result Date: 05/30/2017 CLINICAL DATA:  Squeezing pain in the chest.  Nausea. EXAM: CT ANGIOGRAPHY CHEST WITH CONTRAST TECHNIQUE: Multidetector CT imaging of the chest was performed using the standard protocol during bolus administration of intravenous contrast. Multiplanar CT image reconstructions and MIPs were obtained to evaluate the vascular anatomy. CONTRAST:  63 mL Isovue 370 COMPARISON:  None. FINDINGS: Cardiovascular: Satisfactory opacification of the pulmonary arteries to the segmental level. No evidence of pulmonary embolism. Normal heart size. No pericardial effusion. Normal caliber thoracic aorta. No aortic dissection. Mediastinum/Nodes: No enlarged mediastinal, hilar, or axillary lymph nodes. Thyroid gland, trachea, and esophagus demonstrate no significant findings. Lungs/Pleura: Lungs  are clear. No pleural effusion or pneumothorax. Upper Abdomen: No acute process demonstrated in the visualized upper abdomen. Musculoskeletal: No chest wall abnormality. No acute or significant osseous findings. Review of the MIP images confirms the above findings. IMPRESSION: No evidence of acute pulmonary embolus. No evidence of active pulmonary disease. Electronically Signed   By: Lucienne Capers M.D.   On: 05/30/2017 00:22    Cardiac Studies   none  Patient Profile     41 y.o. 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 Pain was moderate to severe and described as acute pressure in the left chest radiating to back. No other radiation or associated symptoms. Ecg OK. Second istat troponin elevated to 0.13. All other troponins are negative. CT chest negative. Etiology of chest pain unclear. Single troponin elevation may be spurious but with symptoms I am concerned she could have SCAD or coronary spasm. Discussed options for evaluation and I would recommend cardiac cath today. The procedure and risks were reviewed including but not limited to death, myocardial infarction, stroke, arrythmias, bleeding, transfusion, emergency surgery, dye allergy, or renal dysfunction. The patient voices understanding and is agreeable to proceed.   For questions or updates, please contact [place] Please consult [email or web address] for contact info under Cardiology/STEMI.      Signed, Peter [place], MD  05/30/2017, 8:51 AM

## 2017-05-30 NOTE — ED Notes (Signed)
Patient back from CT and ambulatory to the [facility]

## 2017-05-30 NOTE — ED Notes (Signed)
Patient placed into a hospital bed for comfort

## 2017-05-30 NOTE — Discharge Summary (Signed)
Discharge Summary    Patient ID: Martha Nelson,  MRN: [id number], DOB/AGE: 1976/08/20 41 y.o.  Admit date: 05/29/2017 Discharge date: 05/30/2017  Primary Care Provider: Patient, No Pcp Per Primary Cardiologist: [place]   Discharge Diagnoses    Active Problems:   NSTEMI (non-ST elevated myocardial infarction) [facility])   Allergies No Known Allergies  Diagnostic Studies/Procedures    Cath: 05/30/17  Conclusion   Conclusions: 1. No angiographically significant coronary artery disease. Question if chest pain and mild troponin elevation were caused by vasospasm. 2. Normal left ventricular contraction and filling pressure.  Recommendations: 1. Remove right femoral artery sheath with manual compression. 2. Start isosorbide mononitrate 30 mg daily for potential coronary vasospasm. 3. If patient remains chest pain-free and is able to ambulate comfortably following 4 hours of bedrest, consider discharge this evening.   _____________   History of Present Illness    Martha Nelson is a 41 y.o. female with no significant PMH who presents with new onset CP.  Pt was at work the afternoon of admission (as an Therapist, sports in [place]), when she noted the sudden onset of SSCP, described as pressure, as if she were being squeezed from her chest and back simultaneously.  She had some associated nausea, but denied SOB, or excessive sweating.   The pain waxed and waned but never completely resolved.  She presented to the ED for further evaluation.  Her initial VS and ECG were unremarkable.  Labs notable for negative initial troponin, followed by subsequent positive troponin at 0.13.  The remainder of labs were WNL.  She had  CTA which ruled out PE and aortic dissection.  She denied any family history of premature coronary disease, rheumatologic disease. Pt had not had any recent viral illnesses or unusual stressors.  Her youngest child was born 85 years prior. Given her symptoms she was admitted with plans for  cardiac cath.   Hospital Course    Troponins cycled and only one positive noted at 0.13. Given her symptoms she was referred for cardiac cath. Underwent cath with Dr. Saunders Revel noted above with no significant CAD noted. No complications noted post cath. Groin site stable. She was started on Imdur for possible vasospasm. Post cath instructions/restrictions given.   Martha Nelson was seen by Dr. Saunders Revel and determined stable for discharge home. Follow up in the office has been arranged. Medications are listed below.   _____________  Discharge Vitals Blood pressure 121/60, pulse 76, temperature 98.5 F (36.9 C), temperature source Oral, resp. rate 13, height 5\' 4"  (1.626 m), weight 242 lb (109.8 kg), SpO2 96 %.  Filed Weights   05/30/17 0100  Weight: 242 lb (109.8 kg)    Labs & Radiologic Studies    CBC  Recent Labs  05/29/17 1650 05/30/17 0315  WBC 8.3 9.5  HGB 13.1 12.4  HCT 38.9 37.6  MCV 87.2 87.4  PLT 187 741   Basic Metabolic Panel  Recent Labs  05/29/17 1650 05/30/17 0315  NA 137 138  K 3.5 3.8  CL 102 102  CO2 22 28  GLUCOSE 94 96  BUN 9 10  CREATININE 0.55 0.73  CALCIUM 9.6 9.0   Liver Function Tests No results for input(s): AST, ALT, ALKPHOS, BILITOT, PROT, ALBUMIN in the last 72 hours. No results for input(s): LIPASE, AMYLASE in the last 72 hours. Cardiac Enzymes  Recent Labs  05/29/17 0049 05/30/17 0205  TROPONINI <0.03 <0.03   BNP Invalid input(s): POCBNP D-Dimer No results for input(s):  DDIMER in the last 72 hours. Hemoglobin A1C  Recent Labs  05/30/17 0315  HGBA1C 4.9   Fasting Lipid Panel  Recent Labs  05/30/17 0315  CHOL 160  HDL 50  LDLCALC 100*  TRIG 51  CHOLHDL 3.2   Thyroid Function Tests No results for input(s): TSH, T4TOTAL, T3FREE, THYROIDAB in the last 72 hours.  Invalid input(s): FREET3 _____________  Dg Chest 2 View  Result Date: 05/29/2017 CLINICAL DATA:  Acute left chest pain radiating to the left shoulder EXAM:  CHEST  2 VIEW COMPARISON:  07/01/2015 FINDINGS: The heart size and mediastinal contours are within normal limits. Both lungs are clear. The visualized skeletal structures are unremarkable. IMPRESSION: No active cardiopulmonary disease. Electronically Signed   By: Jerilynn Mages.  Shick M.D.   On: 05/29/2017 18:07   Ct Angio Chest Pe W And/or Wo Contrast  Result Date: 05/30/2017 CLINICAL DATA:  Squeezing pain in the chest.  Nausea. EXAM: CT ANGIOGRAPHY CHEST WITH CONTRAST TECHNIQUE: Multidetector CT imaging of the chest was performed using the standard protocol during bolus administration of intravenous contrast. Multiplanar CT image reconstructions and MIPs were obtained to evaluate the vascular anatomy. CONTRAST:  63 mL Isovue 370 COMPARISON:  None. FINDINGS: Cardiovascular: Satisfactory opacification of the pulmonary arteries to the segmental level. No evidence of pulmonary embolism. Normal heart size. No pericardial effusion. Normal caliber thoracic aorta. No aortic dissection. Mediastinum/Nodes: No enlarged mediastinal, hilar, or axillary lymph nodes. Thyroid gland, trachea, and esophagus demonstrate no significant findings. Lungs/Pleura: Lungs are clear. No pleural effusion or pneumothorax. Upper Abdomen: No acute process demonstrated in the visualized upper abdomen. Musculoskeletal: No chest wall abnormality. No acute or significant osseous findings. Review of the MIP images confirms the above findings. IMPRESSION: No evidence of acute pulmonary embolus. No evidence of active pulmonary disease. Electronically Signed   By: Lucienne Capers M.D.   On: 05/30/2017 00:22   Disposition   Pt is being discharged home today in good condition.  Follow-up Plans & Appointments    Follow-up Information    [place], Peter M, MD Follow up on 07/05/2017.   Specialty:  Cardiology Why:  at 8am for your follow up appt.  Contact information: [address] [place] [address] [phone]           Discharge Instructions    Diet - low sodium heart healthy    Complete by:  As directed    Discharge instructions    Complete by:  As directed    PLEASE REMEMBER TO BRING ALL OF YOUR MEDICATIONS TO EACH OF YOUR FOLLOW-UP OFFICE VISITS.  PLEASE ATTEND ALL SCHEDULED FOLLOW-UP APPOINTMENTS.   Activity: Increase activity slowly as tolerated. You may shower, but no soaking baths (or swimming) for 1 week. No driving for 24 hours. No lifting over 5 lbs for 1 week. No sexual activity for 1 week.   Wound Care: You may wash cath site gently with soap and water. Keep cath site clean and dry. If you notice pain, swelling, bleeding or pus at your cath site, please call [phone].   Increase activity slowly    Complete by:  As directed       Discharge Medications     Medication List    STOP taking these medications   albuterol 108 (90 Base) MCG/ACT inhaler Commonly known as:  PROVENTIL HFA;VENTOLIN HFA   azithromycin 250 MG tablet Commonly known as:  ZITHROMAX   chlorpheniramine-HYDROcodone 10-8 MG/5ML Suer Commonly known as:  Cathie Hoops ER  TAKE these medications   isosorbide mononitrate 30 MG 24 hr tablet Commonly known as:  IMDUR Take 1 tablet (30 mg total) by mouth daily.       Outstanding Labs/Studies   N/a  Duration of Discharge Encounter   Greater than 30 minutes including physician time.  Signed, Reino Bellis NP-C 05/30/2017, 5:25 PM

## 2017-05-30 NOTE — Discharge Instructions (Signed)
Angiogram, Care After °This sheet gives you information about how to care for yourself after your procedure. Your health care provider may also give you more specific instructions. If you have problems or questions, contact your health care provider. °What can I expect after the procedure? °After the procedure, it is common to have bruising and tenderness at the catheter insertion area. °Follow these instructions at home: °Insertion site care °· Follow instructions from your health care provider about how to take care of your insertion site. Make sure you: °? Wash your hands with soap and water before you change your bandage (dressing). If soap and water are not available, use hand sanitizer. °? Change your dressing as told by your health care provider. °? Leave stitches (sutures), skin glue, or adhesive strips in place. These skin closures may need to stay in place for 2 weeks or longer. If adhesive strip edges start to loosen and curl up, you may trim the loose edges. Do not remove adhesive strips completely unless your health care provider tells you to do that. °· Do not take baths, swim, or use a hot tub until your health care provider approves. °· You may shower 24-48 hours after the procedure or as told by your health care provider. °? Gently wash the site with plain soap and water. °? Pat the area dry with a clean towel. °? Do not rub the site. This may cause bleeding. °· Do not apply powder or lotion to the site. Keep the site clean and dry. °· Check your insertion site every day for signs of infection. Check for: °? Redness, swelling, or pain. °? Fluid or blood. °? Warmth. °? Pus or a bad smell. °Activity °· Rest as told by your health care provider, usually for 1-2 days. °· Do not lift anything that is heavier than 10 lbs. (4.5 kg) or as told by your health care provider. °· Do not drive for 24 hours if you were given a medicine to help you relax (sedative). °· Do not drive or use heavy machinery while  taking prescription pain medicine. °General instructions °· Return to your normal activities as told by your health care provider, usually in about a week. Ask your health care provider what activities are safe for you. °· If the catheter site starts bleeding, lie flat and put pressure on the site. If the bleeding does not stop, get help right away. This is a medical emergency. °· Drink enough fluid to keep your urine clear or pale yellow. This helps flush the contrast dye from your body. °· Take over-the-counter and prescription medicines only as told by your health care provider. °· Keep all follow-up visits as told by your health care provider. This is important. °Contact a health care provider if: °· You have a fever or chills. °· You have redness, swelling, or pain around your insertion site. °· You have fluid or blood coming from your insertion site. °· The insertion site feels warm to the touch. °· You have pus or a bad smell coming from your insertion site. °· You have bruising around the insertion site. °· You notice blood collecting in the tissue around the catheter site (hematoma). The hematoma may be painful to the touch. °Get help right away if: °· You have severe pain at the catheter insertion area. °· The catheter insertion area swells very fast. °· The catheter insertion area is bleeding, and the bleeding does not stop when you hold steady pressure on the area. °·   The area near or just beyond the catheter insertion site becomes pale, cool, tingly, or numb. °These symptoms may represent a serious problem that is an emergency. Do not wait to see if the symptoms will go away. Get medical help right away. Call your local emergency services (911 in the [place] Do not drive yourself to the hospital. °Summary °· After the procedure, it is common to have bruising and tenderness at the catheter insertion area. °· After the procedure, it is important to rest and drink plenty of fluids. °· Do not take baths,  swim, or use a hot tub until your health care provider says it is okay to do so. You may shower 24-48 hours after the procedure or as told by your health care provider. °· If the catheter site starts bleeding, lie flat and put pressure on the site. If the bleeding does not stop, get help right away. This is a medical emergency. °This information is not intended to replace advice given to you by your health care provider. Make sure you discuss any questions you have with your health care provider. °Document Released: 03/15/2005 Document Revised: 08/01/2016 Document Reviewed: 08/01/2016 °Elsevier Interactive Patient Education © 2017 [employer] °Moderate Conscious Sedation, Adult, Care After °These instructions provide you with information about caring for yourself after your procedure. Your health care provider may also give you more specific instructions. Your treatment has been planned according to current medical practices, but problems sometimes occur. Call your health care provider if you have any problems or questions after your procedure. °What can I expect after the procedure? °After your procedure, it is common: °· To feel sleepy for several hours. °· To feel clumsy and have poor balance for several hours. °· To have poor judgment for several hours. °· To vomit if you eat too soon. ° °Follow these instructions at home: °For at least 24 hours after the procedure: ° °· Do not: °? Participate in activities where you could fall or become injured. °? Drive. °? Use heavy machinery. °? Drink alcohol. °? Take sleeping pills or medicines that cause drowsiness. °? Make important decisions or sign legal documents. °? Take care of children on your own. °· Rest. °Eating and drinking °· Follow the diet recommended by your health care provider. °· If you vomit: °? Drink water, juice, or soup when you can drink without vomiting. °? Make sure you have little or no nausea before eating solid foods. °General  instructions °· Have a responsible adult stay with you until you are awake and alert. °· Take over-the-counter and prescription medicines only as told by your health care provider. °· If you smoke, do not smoke without supervision. °· Keep all follow-up visits as told by your health care provider. This is important. °Contact a health care provider if: °· You keep feeling nauseous or you keep vomiting. °· You feel light-headed. °· You develop a rash. °· You have a fever. °Get help right away if: °· You have trouble breathing. °This information is not intended to replace advice given to you by your health care provider. Make sure you discuss any questions you have with your health care provider. °Document Released: 06/17/2013 Document Revised: 01/30/2016 Document Reviewed: 12/17/2015 °Elsevier Interactive Patient Education © 2018 [employer] Inc. ° ° °

## 2017-05-30 NOTE — H&P (Signed)
Cardiology History & Physical    Patient ID: Martha Nelson MRN: [id number], DOB: 21-Jul-1976 Date of Encounter: 05/30/2017, 12:53 AM Primary Physician: Patient, No Pcp Per  Chief Complaint: CP  HPI: Martha Nelson is a 41 y.o. female with no significant PMH who presents with new onset CP.  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  She had some associated nausea, but denied SOB, or excessive sweating.   The pt has waxed and waned but never completely resolved.  She presented to the ED for further evaluation.  Her initial VS and ECG were unremarkable.  Labs notable for negative initial troponin, followed by subsequent positive troponin at 0.13.  The remainder of labs were WNL.  She had  CTA which ruled out PE and aortic dissection.  She denies any family history of premature coronary disease, rheumatologic disease.  Pt has not had any recent viral illnesses or unusual stressors.  Her youngest child was born 28 years prior.   Past Medical History:  Diagnosis Date  . Obesity      Surgical History:  Past Surgical History:  Procedure Laterality Date  . CESAREAN SECTION    . CHOLECYSTECTOMY       Home Meds: Prior to Admission medications   Medication Sig Start Date End Date Taking? Authorizing Provider  albuterol (PROVENTIL HFA;VENTOLIN HFA) 108 (90 Base) MCG/ACT inhaler Inhale 2 puffs into the lungs every 4 (four) hours as needed for wheezing or shortness of breath (cough, shortness of breath or wheezing.). Patient not taking: Reported on 05/29/2017 08/15/16   Robyn Haber, MD  azithromycin (ZITHROMAX) 250 MG tablet Take 1 tablet (250 mg total) by mouth daily. Take first 2 tablets together, then 1 every day until finished. Patient not taking: Reported on 05/29/2017 08/15/16   Robyn Haber, MD  chlorpheniramine-HYDROcodone [facility]  PENNKINETIC ER) 10-8 MG/5ML SUER Take 5 mLs by mouth at bedtime as needed for cough. Patient not taking: Reported on 05/29/2017 08/15/16   Robyn Haber, MD    Allergies: No Known Allergies  Social History   Social History  . Marital status: Married    Spouse name: N/A  . Number of children: N/A  . Years of education: N/A   Occupational History  . Not on file.   Social History Main Topics  . Smoking status: Never Smoker  . Smokeless tobacco: Not on file  . Alcohol use No  . Drug use: No  . Sexual activity: Not on file   Other Topics Concern  . Not on file   Social History Narrative  . No narrative on file     History reviewed. No pertinent family history.  Review of Systems: All other systems reviewed and are otherwise negative except as noted above.  Labs:   Lab Results  Component Value Date   WBC 8.3 05/29/2017   HGB 13.1 05/29/2017   HCT 38.9 05/29/2017   MCV 87.2 05/29/2017   PLT 187 05/29/2017    Recent Labs Lab 05/29/17 1650  NA 137  K 3.5  CL 102  CO2 22  BUN 9  CREATININE 0.55  CALCIUM 9.6  GLUCOSE 94   No results for input(s): CKTOTAL, CKMB, TROPONINI in the last 72 hours. No results found for: CHOL, HDL, LDLCALC, TRIG No results found for: DDIMER  Radiology/Studies:  Dg Chest 2 View  Result Date: 05/29/2017 CLINICAL DATA:  Acute left chest pain radiating to the left shoulder EXAM: CHEST  2 VIEW COMPARISON:  07/01/2015 FINDINGS: The heart size and mediastinal contours are within normal limits. Both lungs are clear. The visualized skeletal structures are unremarkable. IMPRESSION: No active cardiopulmonary disease. Electronically Signed   By: Jerilynn Mages.  Shick M.D.   On: 05/29/2017 18:07   Ct Angio Chest Pe W And/or Wo Contrast  Result Date: 05/30/2017 CLINICAL DATA:  Squeezing pain in the chest.  Nausea. EXAM: CT ANGIOGRAPHY CHEST WITH CONTRAST TECHNIQUE: Multidetector CT imaging of the chest was performed using the standard protocol during bolus  administration of intravenous contrast. Multiplanar CT image reconstructions and MIPs were obtained to evaluate the vascular anatomy. CONTRAST:  63 mL Isovue 370 COMPARISON:  None. FINDINGS: Cardiovascular: Satisfactory opacification of the pulmonary arteries to the segmental level. No evidence of pulmonary embolism. Normal heart size. No pericardial effusion. Normal caliber thoracic aorta. No aortic dissection. Mediastinum/Nodes: No enlarged mediastinal, hilar, or axillary lymph nodes. Thyroid gland, trachea, and esophagus demonstrate no significant findings. Lungs/Pleura: Lungs are clear. No pleural effusion or pneumothorax. Upper Abdomen: No acute process demonstrated in the visualized upper abdomen. Musculoskeletal: No chest wall abnormality. No acute or significant osseous findings. Review of the MIP images confirms the above findings. IMPRESSION: No evidence of acute pulmonary embolus. No evidence of active pulmonary disease. Electronically Signed   By: Lucienne Capers M.D.   On: 05/30/2017 00:22   Wt Readings from Last 3 Encounters:  07/01/15 109.8 kg (242 lb)    EKG: NSR, no notable ischemic changes.  Physical Exam: Blood pressure 118/72, pulse 84, temperature 98.7 F (37.1 C), temperature source Oral, resp. rate 20, SpO2 98 %. There is no height or weight on file to calculate BMI. General: Well developed, well nourished, in no acute distress. Head: Normocephalic, atraumatic, sclera non-icteric, no xanthomas, nares are without discharge.  Neck: Negative for carotid bruits. JVD not elevated. Lungs: Clear bilaterally to auscultation without wheezes, rales, or rhonchi. Breathing is unlabored. Heart: RRR with S1 S2. No murmurs, rubs, or gallops appreciated. Abdomen: Soft, non-tender, non-distended with normoactive bowel sounds. No hepatomegaly. No rebound/guarding. No obvious abdominal masses. Msk:  Strength and tone appear normal for age. Extremities: No clubbing or cyanosis. No edema.   Distal pedal pulses are 2+ and equal bilaterally. Neuro: Alert and oriented X 3. No focal deficit. No facial asymmetry. Moves all extremities spontaneously. Psych:  Responds to questions appropriately with a normal affect.    Assessment and Plan  41 y.o. female with no significant PMH who presents with CP, found to have positive troponin.  1.  NSTEMI: Unclear etiology at present; typical plaque rupture physiology less likely in this demographic.  Spontaneous coronary dissection is at the top of DDx.  PE and acute aortic syndromes have been ruled out with CTA,  No compelling history for Takotsubo's, viral peri/myocarditis, or underlying rheumatologic condition.  Will start heparin gtt and ASA, continue to trend enzymes and keep NPO for possible cath.  Will obtain TTE in AM  Signed, Doylene Canning MD 05/30/2017, 12:53 AM

## 2017-05-30 NOTE — Progress Notes (Signed)
ANTICOAGULATION CONSULT NOTE - Initial Consult  Pharmacy Consult for heparin Indication: chest pain/ACS  No Known Allergies  Patient Measurements: Height: 5\' 4"  (162.6 cm) (per office visit 10/2016) Weight: 242 lb (109.8 kg) IBW/kg (Calculated) : 54.7 Heparin Dosing Weight: 80.8 kg   Vital Signs: Temp: 98.1 F (36.7 C) (09/20 0802) Temp Source: Oral (09/20 0802) BP: 113/84 (09/20 0802) Pulse Rate: 78 (09/20 0802)  Labs:  Recent Labs  05/29/17 0049 05/29/17 1650 05/30/17 0205 05/30/17 0242 05/30/17 0315 05/30/17 0751  HGB  --  13.1  --   --  12.4  --   HCT  --  38.9  --   --  37.6  --   PLT  --  187  --   --  182  --   LABPROT  --   --   --  12.7  --  13.1  INR  --   --   --  0.97  --  1.00  HEPARINUNFRC  --   --   --   --   --  0.20*  CREATININE  --  0.55  --   --  0.73  --   TROPONINI <0.03  --  <0.03  --   --   --     Estimated Creatinine Clearance: 114.3 mL/min (by C-G formula based on SCr of 0.73 mg/dL).   Medical History: Past Medical History:  Diagnosis Date  . Obesity     Assessment: 41 yo female admitted with chest pain. Pharmacy consulted to dose heparin. No oral anticoagulation PTA. Troponin elevated at 0.13. First heparin level subtherapeutic at 0.2. Per cards plan to go to cath today.  CBC stable and no s/s bleeding noted. Renal function normal.   Goal of Therapy:  Heparin level 0.3-0.7 units/ml Monitor platelets by anticoagulation protocol: Yes   Plan:  Heparin bolus 1500 units Increase heparin 1200 units/hr (2 unit/kg increase) Follow post-cath plans - daily heparin level and CBC if heparin is to continue post-cath Monitor for s/s bleeding   Charlene Brooke, PharmD PGY1 [facility] Resident Pager: [phone] After 4:00PM please call [place] [phone] 05/30/17 8:53 AM

## 2017-05-30 NOTE — ED Notes (Signed)
Patient stated she did not want to get into a hospital gown and would wait until her husband brought clothing in for her.

## 2017-05-30 NOTE — ED Notes (Addendum)
Patient stated her CP decreased to 3/10 after the first NTG

## 2017-05-30 NOTE — Progress Notes (Addendum)
Site area: RFA Site Prior to Removal:  Level 0 Pressure Applied For: 20 min Manual: yes   Patient Status During Pull:  stable Post Pull Site:  Level Post Pull Instructions Given:  yes Post Pull Pulses Present: palpable Dressing Applied:  tegaderm Bedrest begins @ 1510 till 1910 Comments:

## 2017-05-30 NOTE — Interval H&P Note (Signed)
History and Physical Interval Note:  05/30/2017 12:59 PM  Martha Nelson  has presented today for cardiac catheterization, with the diagnosis of NSTEMI. The various methods of treatment have been discussed with the patient and family. After consideration of risks, benefits and other options for treatment, the patient has consented to  Procedure(s): LEFT HEART CATH AND CORONARY ANGIOGRAPHY (N/A) as a surgical intervention .  The patient's history has been reviewed, patient examined, no change in status, stable for surgery.  I have reviewed the patient's chart and labs.  Questions were answered to the patient's satisfaction.    Cath Lab Visit (complete for each Cath Lab visit)  Clinical Evaluation Leading to the Procedure:   ACS: Yes.    Non-ACS: N/A  [name]

## 2017-05-30 NOTE — ED Notes (Signed)
Patient stated her CP is gone at this time 0/10

## 2017-05-30 NOTE — Progress Notes (Signed)
ANTICOAGULATION CONSULT NOTE - Initial Consult  Pharmacy Consult for heparin Indication: chest pain/ACS  No Known Allergies  Patient Measurements: Weight: 242 lb (109.8 kg) Heparin Dosing Weight: 80.8 kg   Vital Signs: Temp: 98.7 F (37.1 C) (09/19 2107) Temp Source: Oral (09/19 2107) BP: 118/72 (09/19 2230) Pulse Rate: 84 (09/19 2230)  Labs:  Recent Labs  05/29/17 1650  HGB 13.1  HCT 38.9  PLT 187  CREATININE 0.55    CrCl cannot be calculated (Unknown ideal weight.).   Medical History: Past Medical History:  Diagnosis Date  . Obesity     Assessment: 41 yo female admitted with chest pain. Pharmacy consulted to dose heparin. No oral anticoagulation PTA. Troponin elevated at 0.13.   CBC stable and no s/s bleeding noted. Renal function normal.   Goal of Therapy:  Heparin level 0.3-0.7 units/ml Monitor platelets by anticoagulation protocol: Yes   Plan:  Heparin bolus 4000 units IV x1 Start heparin infusion at 1000 units/hr Heparin level in 6 hrs Daily heparin level and CBC Monitor for s/s bleeding  Argie Ramming, PharmD Clinical Pharmacist 05/30/17 1:35 AM

## 2017-05-30 NOTE — H&P (View-Only) (Signed)
Progress Note  Patient Name: Martha Nelson Date of Encounter: 05/30/2017  Primary Cardiologist: [place]  Subjective   Feels better. States pain got better once she received Ntg. No relief with NSAIDs or Antacids. Still having 1/10 pain.   Inpatient Medications    Scheduled Meds: . [START ON 05/31/2017] aspirin EC  81 mg Oral Daily   Continuous Infusions: . heparin 1,000 Units/hr (05/30/17 0216)   PRN Meds: acetaminophen, nitroGLYCERIN, ondansetron (ZOFRAN) IV   Vital Signs    Vitals:   05/30/17 0500 05/30/17 0600 05/30/17 0700 05/30/17 0802  BP: 103/63 (!) 96/59 101/68 113/84  Pulse: 72 64 85 78  Resp: 15 14 16 16   Temp:    98.1 F (36.7 C)  TempSrc:    Oral  SpO2: 94% 93% 96% 96%  Weight:      Height:       No intake or output data in the 24 hours ending 05/30/17 0851 Filed Weights   05/30/17 0100  Weight: 242 lb (109.8 kg)    Telemetry    NSR - Personally Reviewed  ECG    Pending today- Personally Reviewed  Physical Exam   GEN: No acute distress.   Neck: No JVD Cardiac: RRR, no murmurs, rubs, or gallops.  Respiratory: Clear to auscultation bilaterally. GI: Soft, nontender, non-distended  MS: No edema; No deformity. Neuro:  Nonfocal  Psych: Normal affect   Labs    Chemistry Recent Labs Lab 05/29/17 1650 05/30/17 0315  NA 137 138  K 3.5 3.8  CL 102 102  CO2 22 28  GLUCOSE 94 96  BUN 9 10  CREATININE 0.55 0.73  CALCIUM 9.6 9.0  GFRNONAA >60 >60  GFRAA >60 >60  ANIONGAP 13 8     Hematology Recent Labs Lab 05/29/17 1650 05/30/17 0315  WBC 8.3 9.5  RBC 4.46 4.30  HGB 13.1 12.4  HCT 38.9 37.6  MCV 87.2 87.4  MCH 29.4 28.8  MCHC 33.7 33.0  RDW 12.9 13.1  PLT 187 182    Cardiac Enzymes Recent Labs Lab 05/29/17 0049 05/30/17 0205  TROPONINI <0.03 <0.03    Recent Labs Lab 05/29/17 1746 05/29/17 2245  TROPIPOC 0.01 0.13*     BNPNo results for input(s): BNP, PROBNP in the last 168 hours.   DDimer No results for  input(s): DDIMER in the last 168 hours.   Radiology    Dg Chest 2 View  Result Date: 05/29/2017 CLINICAL DATA:  Acute left chest pain radiating to the left shoulder EXAM: CHEST  2 VIEW COMPARISON:  07/01/2015 FINDINGS: The heart size and mediastinal contours are within normal limits. Both lungs are clear. The visualized skeletal structures are unremarkable. IMPRESSION: No active cardiopulmonary disease. Electronically Signed   By: Jerilynn Mages.  Shick M.D.   On: 05/29/2017 18:07   Ct Angio Chest Pe W And/or Wo Contrast  Result Date: 05/30/2017 CLINICAL DATA:  Squeezing pain in the chest.  Nausea. EXAM: CT ANGIOGRAPHY CHEST WITH CONTRAST TECHNIQUE: Multidetector CT imaging of the chest was performed using the standard protocol during bolus administration of intravenous contrast. Multiplanar CT image reconstructions and MIPs were obtained to evaluate the vascular anatomy. CONTRAST:  63 mL Isovue 370 COMPARISON:  None. FINDINGS: Cardiovascular: Satisfactory opacification of the pulmonary arteries to the segmental level. No evidence of pulmonary embolism. Normal heart size. No pericardial effusion. Normal caliber thoracic aorta. No aortic dissection. Mediastinum/Nodes: No enlarged mediastinal, hilar, or axillary lymph nodes. Thyroid gland, trachea, and esophagus demonstrate no significant findings. Lungs/Pleura: Lungs  are clear. No pleural effusion or pneumothorax. Upper Abdomen: No acute process demonstrated in the visualized upper abdomen. Musculoskeletal: No chest wall abnormality. No acute or significant osseous findings. Review of the MIP images confirms the above findings. IMPRESSION: No evidence of acute pulmonary embolus. No evidence of active pulmonary disease. Electronically Signed   By: Lucienne Capers M.D.   On: 05/30/2017 00:22    Cardiac Studies   none  Patient Profile     41 y.o. 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 Pain was moderate to severe and described as acute pressure in the left chest radiating to back. No other radiation or associated symptoms. Ecg OK. Second istat troponin elevated to 0.13. All other troponins are negative. CT chest negative. Etiology of chest pain unclear. Single troponin elevation may be spurious but with symptoms I am concerned she could have SCAD or coronary spasm. Discussed options for evaluation and I would recommend cardiac cath today. The procedure and risks were reviewed including but not limited to death, myocardial infarction, stroke, arrythmias, bleeding, transfusion, emergency surgery, dye allergy, or renal dysfunction. The patient voices understanding and is agreeable to proceed.   For questions or updates, please contact [place] Please consult [email or web address] for contact info under Cardiology/STEMI.      Signed, Yehudis Monceaux [place], MD  05/30/2017, 8:51 AM

## 2017-05-31 ENCOUNTER — Encounter (HOSPITAL_COMMUNITY): Payer: Self-pay | Admitting: Internal Medicine

## 2017-07-05 ENCOUNTER — Ambulatory Visit: Payer: 59 | Admitting: Cardiology

## 2017-07-10 MED FILL — ISOSORBIDE MN ER 30 MG TAB: 30 | 30 days supply | Qty: 30 | Fill #1

## 2017-07-18 NOTE — Progress Notes (Deleted)
Cardiology Office Note   Date:  07/18/2017   ID:  Martha Nelson, DOB Jan 30, 1976, MRN [id number]  PCP:  Patient, No Pcp Per  Cardiologist:   Corin Tilly Martinique, MD   No chief complaint on file.     History of Present Illness: Martha Nelson is a 41 y.o. female who presents for follow up post hospitalization. 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 She was admitted on 05/30/17 with acute chest pain. Ecg was normal. An istat troponin was 0.13 but all other troponins were negative. CT chest was normal. Cardiac cath was also normal. She had no further chest pain and was discharge on isosorbide for possible spasm.     Past Medical History:  Diagnosis Date  . Obesity     Past Surgical History:  Procedure Laterality Date  . CESAREAN SECTION    . CHOLECYSTECTOMY       Current Outpatient Medications  Medication Sig Dispense Refill  . isosorbide mononitrate (IMDUR) 30 MG 24 hr tablet Take 1 tablet (30 mg total) by mouth daily. 30 tablet 5   No current facility-administered medications for this visit.     Allergies:   Patient has no known allergies.    Social History:  The patient  reports that  has never smoked. She does not have any smokeless tobacco history on file. She reports that she does not drink alcohol or use drugs.   Family History:  The patient's ***family history is not on file.    ROS:  Please see the history of present illness.   Otherwise, review of systems are positive for none.   All other systems are reviewed and negative.    PHYSICAL EXAM: VS:  There were no vitals taken for this visit. , BMI There is no height or weight on file to calculate BMI. GEN: Well nourished, well developed, in no acute distress  HEENT: normal  Neck: no JVD, carotid bruits, or masses Cardiac: RRR; no murmurs, rubs, or gallops,no edema  Respiratory:  clear to auscultation bilaterally, normal work of breathing GI: soft, nontender, nondistended, + BS MS: no deformity or  atrophy  Skin: warm and dry, no rash Neuro:  Strength and sensation are intact Psych: euthymic mood, full affect   EKG:  EKG {ACTION; IS/IS [id number]} ordered today. The ekg ordered today demonstrates ***   Recent Labs: 05/30/2017: BUN 10; Creatinine, Ser 0.73; Hemoglobin 12.4; Platelets 182; Potassium 3.8; Sodium 138    Lipid Panel    Component Value Date/Time   CHOL 160 05/30/2017 0315   TRIG 51 05/30/2017 0315   HDL 50 05/30/2017 0315   CHOLHDL 3.2 05/30/2017 0315   VLDL 10 05/30/2017 0315   LDLCALC 100 (H) 05/30/2017 0315      Wt Readings from Last 3 Encounters:  05/30/17 242 lb (109.8 kg)  07/01/15 242 lb (109.8 kg)      Other studies Reviewed: Additional studies/ records that were reviewed today include:   Procedures   LEFT HEART CATH AND CORONARY ANGIOGRAPHY  Conclusion   Conclusions: 1. No angiographically significant coronary artery disease. Question if chest pain and mild troponin elevation were caused by vasospasm. 2. Normal left ventricular contraction and filling pressure.  Recommendations: 1. Remove right femoral artery sheath with manual compression. 2. Start isosorbide mononitrate 30 mg daily for potential coronary vasospasm. 3. If patient remains chest pain-free and is able to ambulate comfortably following 4 hours of bedrest, consider discharge this evening.  Christopher End,  MD   CT ANGIOGRAPHY CHEST WITH CONTRAST  TECHNIQUE: Multidetector CT imaging of the chest was performed using the standard protocol during bolus administration of intravenous contrast. Multiplanar CT image reconstructions and MIPs were obtained to evaluate the vascular anatomy.  CONTRAST:  63 mL Isovue 370  COMPARISON:  None.  FINDINGS: Cardiovascular: Satisfactory opacification of the pulmonary arteries to the segmental level. No evidence of pulmonary embolism. Normal heart size. No pericardial effusion. Normal caliber thoracic aorta. No aortic  dissection.  Mediastinum/Nodes: No enlarged mediastinal, hilar, or axillary lymph nodes. Thyroid gland, trachea, and esophagus demonstrate no significant findings.  Lungs/Pleura: Lungs are clear. No pleural effusion or pneumothorax.  Upper Abdomen: No acute process demonstrated in the visualized upper abdomen.  Musculoskeletal: No chest wall abnormality. No acute or significant osseous findings.  Review of the MIP images confirms the above findings.  IMPRESSION: No evidence of acute pulmonary embolus. No evidence of active pulmonary disease.   Electronically Signed   By: Lucienne Capers M.D.   ASSESSMENT AND PLAN:  1.  ***   Current medicines are reviewed at length with the patient today.  The patient {ACTIONS; HAS/DOES NOT HAVE:19233} concerns regarding medicines.  The following changes have been made:  {PLAN; NO CHANGE:13088:s}  Labs/ tests ordered today include: *** No orders of the defined types were placed in this encounter.    Disposition:   FU with *** in {gen number [id number]} {Days to years:10300}  Signed, Blayklee Mable Martinique, MD  07/18/2017 7:43 AM    [facility] [address] [place], [place], [address] Phone [phone], Fax [phone]

## 2017-07-22 ENCOUNTER — Ambulatory Visit: Payer: 59 | Admitting: Cardiology

## 2017-07-23 ENCOUNTER — Encounter: Payer: Self-pay | Admitting: *Deleted

## 2017-12-23 MED FILL — PANTOPRAZOLE SOD DR 40 MG T: 40 | 30 days supply | Qty: 30 | Fill #0

## 2017-12-25 MED FILL — CARAFATE 1 GM/10 ML SUSP: 1 | 30 days supply | Qty: 600 | Fill #0

## 2019-05-09 ENCOUNTER — Emergency Department (HOSPITAL_COMMUNITY): Payer: 59

## 2019-05-09 ENCOUNTER — Encounter (HOSPITAL_COMMUNITY): Payer: Self-pay | Admitting: *Deleted

## 2019-05-09 ENCOUNTER — Emergency Department (HOSPITAL_COMMUNITY)
Admission: EM | Admit: 2019-05-09 | Discharge: 2019-05-09 | Disposition: A | Discharge status: Home / Self Care | Payer: 59 | Attending: Emergency Medicine | Admitting: Emergency Medicine

## 2019-05-09 ENCOUNTER — Other Ambulatory Visit: Payer: Self-pay

## 2019-05-09 DIAGNOSIS — Z79899 Other long term (current) drug therapy: Secondary | ICD-10-CM | POA: Insufficient documentation

## 2019-05-09 DIAGNOSIS — I10 Essential (primary) hypertension: Secondary | ICD-10-CM

## 2019-05-09 DIAGNOSIS — I252 Old myocardial infarction: Secondary | ICD-10-CM | POA: Diagnosis not present

## 2019-05-09 DIAGNOSIS — R42 Dizziness and giddiness: Secondary | ICD-10-CM | POA: Insufficient documentation

## 2019-05-09 DIAGNOSIS — R51 Headache: Secondary | ICD-10-CM | POA: Diagnosis present

## 2019-05-09 LAB — CBC WITH DIFFERENTIAL/PLATELET
Abs Immature Granulocytes: 0.04 10*3/uL (ref 0.00–0.07)
Basophils Absolute: 0 10*3/uL (ref 0.0–0.1)
Basophils Relative: 0 %
Eosinophils Absolute: 0.1 10*3/uL (ref 0.0–0.5)
Eosinophils Relative: 1 %
HCT: 42.5 % (ref 36.0–46.0)
Hemoglobin: 14 g/dL (ref 12.0–15.0)
Immature Granulocytes: 0 %
Lymphocytes Relative: 19 %
Lymphs Abs: 1.9 10*3/uL (ref 0.7–4.0)
MCH: 28.9 pg (ref 26.0–34.0)
MCHC: 32.9 g/dL (ref 30.0–36.0)
MCV: 87.6 fL (ref 80.0–100.0)
Monocytes Absolute: 0.6 10*3/uL (ref 0.1–1.0)
Monocytes Relative: 6 %
Neutro Abs: 7.2 10*3/uL (ref 1.7–7.7)
Neutrophils Relative %: 74 %
Platelets: 231 10*3/uL (ref 150–400)
RBC: 4.85 MIL/uL (ref 3.87–5.11)
RDW: 12.7 % (ref 11.5–15.5)
WBC: 9.8 10*3/uL (ref 4.0–10.5)
nRBC: 0 % (ref 0.0–0.2)

## 2019-05-09 LAB — BASIC METABOLIC PANEL
Anion gap: 14 (ref 5–15)
BUN: 11 mg/dL (ref 6–20)
CO2: 22 mmol/L (ref 22–32)
Calcium: 9.7 mg/dL (ref 8.9–10.3)
Chloride: 102 mmol/L (ref 98–111)
Creatinine, Ser: 0.67 mg/dL (ref 0.44–1.00)
GFR calc Af Amer: >60 mL/min (ref >60)
GFR calc non Af Amer: >60 mL/min (ref >60)
Glucose, Bld: 107 mg/dL — ABNORMAL HIGH (ref 70–99)
Potassium: 3.8 mmol/L (ref 3.5–5.1)
Sodium: 138 mmol/L (ref 135–145)

## 2019-05-09 LAB — CBG MONITORING, ED: Glucose-Capillary: 110 mg/dL — ABNORMAL HIGH (ref 70–99)

## 2019-05-09 MED ORDER — SODIUM CHLORIDE 0.9 % IV BOLUS
1000.0000 mL | Freq: Once | INTRAVENOUS | Status: AC
  Administered 2019-05-09: 1000 mL via INTRAVENOUS

## 2019-05-09 MED ORDER — KETOROLAC TROMETHAMINE 30 MG/ML IJ SOLN
30.0000 mg | Freq: Once | INTRAMUSCULAR | Status: AC
  Administered 2019-05-09: 14:00:00 30 mg via INTRAVENOUS
  Filled 2019-05-09: qty 1

## 2019-05-09 NOTE — Discharge Instructions (Addendum)
Return here as needed.  Follow-up with your doctor for recheck.  I would stop the phentermine.

## 2019-05-09 NOTE — ED Triage Notes (Signed)
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 Upon arrival to ed patient c/o headache and blurred vision. Denies weakness. States the fluttering comes and goes.

## 2019-05-11 NOTE — ED Provider Notes (Signed)
[facility] EMERGENCY DEPARTMENT Provider Note   CSN: [id number] Arrival date & time: 05/09/19  1032     History   Chief Complaint Chief Complaint  Patient presents with   Headache   Blurred Vision    HPI Nykole Beil is a 43 y.o. female.     HPI Patient presents to the emergency department with dizziness and elevated blood pressures.  The patient recently started phentermine and she saw her doctor yesterday who advised that her blood pressure was up and they were concerned it was related to the phentermine.  Patient states she was called into work and she worked all night which she states is normal for her.  The patient states that she started feeling dizzy with some blurred vision.  The patient denies chest pain, shortness of breath,neck pain, fever, cough, weakness, numbness, anorexia, edema, abdominal pain, nausea, vomiting, diarrhea, rash, back pain, dysuria, hematemesis, bloody stool, near syncope, or syncope. Past Medical History:  Diagnosis Date   Obesity     Patient Active Problem List   Diagnosis Date Noted   NSTEMI (non-ST elevated myocardial infarction) ([place]) 05/30/2017    Past Surgical History:  Procedure Laterality Date   CESAREAN SECTION     CHOLECYSTECTOMY     LEFT HEART CATH AND CORONARY ANGIOGRAPHY N/A 05/30/2017   Procedure: LEFT HEART CATH AND CORONARY ANGIOGRAPHY;  Surgeon: Nelva Bush, MD;  Location: [facility];  Service: Cardiovascular;  Laterality: N/A;   TUBAL LIGATION       OB History   No obstetric history on file.      Home Medications    Prior to Admission medications   Medication Sig Start Date End Date Taking? Authorizing Provider  phentermine (ADIPEX-P) 37.5 MG tablet Take 37.5 mg by mouth daily.   Yes [provider]  isosorbide mononitrate (IMDUR) 30 MG 24 hr tablet Take 1 tablet (30 mg total) by mouth daily. Patient not taking: Reported on 05/09/2019 05/30/17   Daune Perch, NP     Family History No family history on file.  Social History Social History   Tobacco Use   Smoking status: Never Smoker   Smokeless tobacco: Never Used  Substance Use Topics   Alcohol use: No   Drug use: No     Allergies   Patient has no known allergies.   Review of Systems Review of Systems All other systems negative except as documented in the HPI. All pertinent positives and negatives as reviewed in the HPI.  Physical Exam Updated Vital Signs BP 112/77    Pulse 97    Temp 98.3 F (36.8 C) (Oral)    Resp 17    Ht 5\' 4"  (1.626 m)    Wt 108.4 kg    SpO2 100%    BMI 41.02 kg/m   Physical Exam Vitals signs and nursing note reviewed.  Constitutional:      General: She is not in acute distress.    Appearance: She is well-developed.  HENT:     Head: Normocephalic and atraumatic.  Eyes:     Pupils: Pupils are equal, round, and reactive to light.  Neck:     Musculoskeletal: Normal range of motion and neck supple.  Cardiovascular:     Rate and Rhythm: Normal rate and regular rhythm.     Heart sounds: Normal heart sounds. No murmur. No friction rub. No gallop.   Pulmonary:     Effort: Pulmonary effort is normal. No respiratory distress.  Breath sounds: Normal breath sounds. No wheezing.  Abdominal:     General: Bowel sounds are normal. There is no distension.     Palpations: Abdomen is soft.     Tenderness: There is no abdominal tenderness.  Skin:    General: Skin is warm and dry.     Capillary Refill: Capillary refill takes less than 2 seconds.     Findings: No erythema or rash.  Neurological:     Mental Status: She is alert and oriented to person, place, and time.     GCS: GCS eye subscore is 4. GCS verbal subscore is 5. GCS motor subscore is 6.     Motor: No abnormal muscle tone.     Coordination: Coordination normal.  Psychiatric:        Behavior: Behavior normal.      ED Treatments / Results  Labs (all labs ordered are listed, but only  abnormal results are displayed) Labs Reviewed  BASIC METABOLIC PANEL - Abnormal; Notable for the following components:      Result Value   Glucose, Bld 107 (*)    All other components within normal limits  CBG MONITORING, ED - Abnormal; Notable for the following components:   Glucose-Capillary 110 (*)    All other components within normal limits  CBC WITH DIFFERENTIAL/PLATELET    EKG EKG Interpretation  Date/Time:  Saturday May 09 2019 10:43:34 EDT Ventricular Rate:  124 PR Interval:    QRS Duration: 75 QT Interval:  309 QTC Calculation: 444 R Axis:   94 Text Interpretation:  Sinus tachycardia Probable anterior infarct, old Borderline T abnormalities, inferior leads Confirmed by Lacretia Leigh ([id number]) on 05/09/2019 11:03:23 AM   Radiology No results found.  Procedures Procedures (including critical care time)  Medications Ordered in ED Medications  sodium chloride 0.9 % bolus 1,000 mL (0 mLs Intravenous Stopped 05/09/19 1448)  ketorolac (TORADOL) 30 MG/ML injection 30 mg (30 mg Intravenous Given 05/09/19 1427)     Initial Impression / Assessment and Plan / ED Course  I have reviewed the triage vital signs and the nursing notes.  Pertinent labs & imaging results that were available during my care of the patient were reviewed by me and considered in my medical decision making (see chart for details).        I feel that the patient's symptoms are related to phentermine.  The patient has been stable here in the emergency department.  I did advise her to follow-up with her primary doctor.  Told to return here as needed her blood pressure and pulse rate have responded accordingly.  Patient was given fluids here in the emergency department as well. Final Clinical Impressions(s) / ED Diagnoses   Final diagnoses:  Hypertension, unspecified type    ED Discharge Orders    None       Dalia Heading, PA-C 05/11/19 1557    Lacretia Leigh, MD 05/13/19 1056

## 2019-05-12 MED FILL — LISINOPRIL 10 MG TABS: 10 | 30 days supply | Qty: 30 | Fill #0

## 2019-06-01 MED FILL — LISINOPRIL 20 MG TABLET: 20 | 30 days supply | Qty: 30 | Fill #0

## 2019-06-29 MED FILL — LISINOPRIL 20 MG TABLET: 20 | 30 days supply | Qty: 30 | Fill #1

## 2019-08-13 MED FILL — LISINOPRIL 20 MG TABLET: 20 | 30 days supply | Qty: 30 | Fill #2

## 2019-09-14 MED FILL — LISINOPRIL 20 MG TABLET: 20 | 30 days supply | Qty: 30 | Fill #3

## 2019-10-14 MED FILL — LISINOPRIL 20 MG TABLET: 20 | 30 days supply | Qty: 30 | Fill #4

## 2019-11-16 MED FILL — LISINOPRIL 20 MG TABLET: 20 | 30 days supply | Qty: 30 | Fill #5

## 2020-09-20 MED FILL — predniSONE 20 MG TABS: 20 | 9 days supply | Qty: 18 | Fill #0

## 2021-01-16 ENCOUNTER — Other Ambulatory Visit (HOSPITAL_COMMUNITY): Payer: Self-pay

## 2021-01-16 MED ORDER — CHLORTHALIDONE 25 MG PO TABS
25.0000 mg | ORAL_TABLET | Freq: Every day | ORAL | 11 refills | Status: DC
  Filled 2021-01-16 – 2021-02-28 (×2): qty 30, 30d supply, fill #0
  Filled 2021-04-03: qty 30, 30d supply, fill #1
  Filled 2021-04-30: qty 30, 30d supply, fill #2
  Filled 2021-06-04: qty 30, 30d supply, fill #3
  Filled 2021-08-15: qty 30, 30d supply, fill #4

## 2021-02-28 ENCOUNTER — Other Ambulatory Visit (HOSPITAL_COMMUNITY): Payer: Self-pay

## 2021-04-03 ENCOUNTER — Other Ambulatory Visit (HOSPITAL_COMMUNITY): Payer: Self-pay

## 2021-04-18 ENCOUNTER — Other Ambulatory Visit (HOSPITAL_COMMUNITY): Payer: Self-pay

## 2021-04-18 MED ORDER — METOPROLOL TARTRATE 25 MG PO TABS
25.0000 mg | ORAL_TABLET | Freq: Two times a day (BID) | ORAL | 1 refills | Status: DC
  Filled 2021-04-18: qty 60, 30d supply, fill #0
  Filled 2021-04-18: qty 180, 90d supply, fill #0
  Filled 2021-08-15: qty 60, 30d supply, fill #1

## 2021-04-26 ENCOUNTER — Other Ambulatory Visit (HOSPITAL_COMMUNITY): Payer: Self-pay

## 2021-04-30 ENCOUNTER — Other Ambulatory Visit (HOSPITAL_COMMUNITY): Payer: Self-pay

## 2021-05-01 ENCOUNTER — Other Ambulatory Visit (HOSPITAL_COMMUNITY): Payer: Self-pay

## 2021-05-02 ENCOUNTER — Other Ambulatory Visit (HOSPITAL_COMMUNITY): Payer: Self-pay

## 2021-05-30 ENCOUNTER — Other Ambulatory Visit (HOSPITAL_COMMUNITY): Payer: Self-pay

## 2021-05-30 MED ORDER — SAXENDA 18 MG/3ML ~~LOC~~ SOPN
3.0000 mg | PEN_INJECTOR | Freq: Every day | SUBCUTANEOUS | 2 refills | Status: DC
  Filled 2021-05-30 – 2021-06-07 (×2): qty 15, 30d supply, fill #0

## 2021-05-30 MED ORDER — INSULIN PEN NEEDLE 32G X 4 MM MISC
1 refills | Status: DC
  Filled 2021-05-30: qty 100, 90d supply, fill #0

## 2021-06-05 ENCOUNTER — Other Ambulatory Visit (HOSPITAL_COMMUNITY): Payer: Self-pay

## 2021-06-07 ENCOUNTER — Other Ambulatory Visit (HOSPITAL_COMMUNITY): Payer: Self-pay

## 2021-06-09 ENCOUNTER — Other Ambulatory Visit (HOSPITAL_COMMUNITY): Payer: Self-pay

## 2021-07-05 IMAGING — CT CT HEAD WITHOUT CONTRAST
4 series · 16 of 47 positions shown, 18 images · non-contrast
Comparison: CT head from 12/26/2015

CLINICAL DATA: Acute headache.

EXAM:
CT HEAD WITHOUT CONTRAST
TECHNIQUE: Contiguous axial images were obtained from the base of the skull
through the vertex without intravenous contrast.

[Series 3: head without · axial · non-contrast · 0.40mm/px · z∈[-85,+30]mm · 7 of 31 slices shown, 9 images]
[im 4/31  brain]
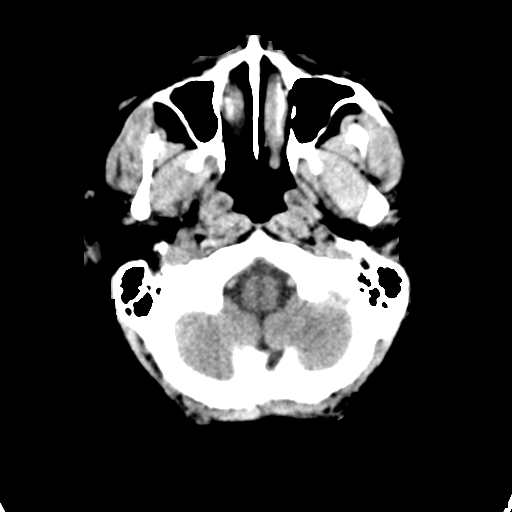
[im 4/31  bone]
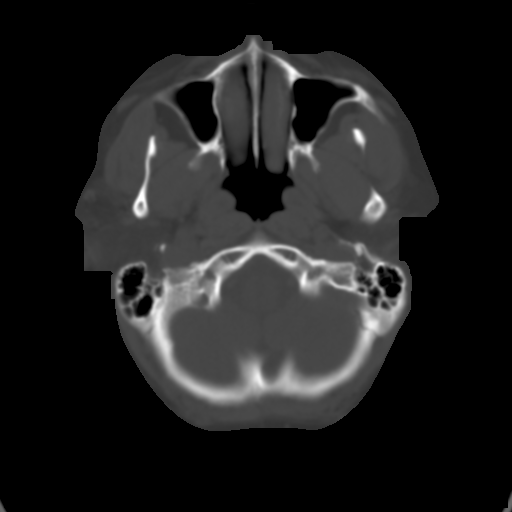
[im 8/31  brain]
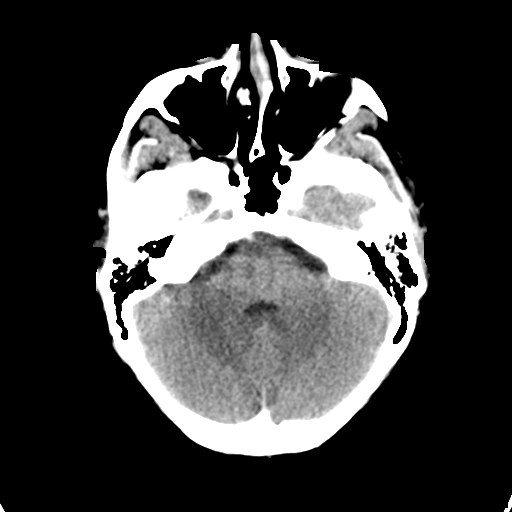
[im 12/31  brain]
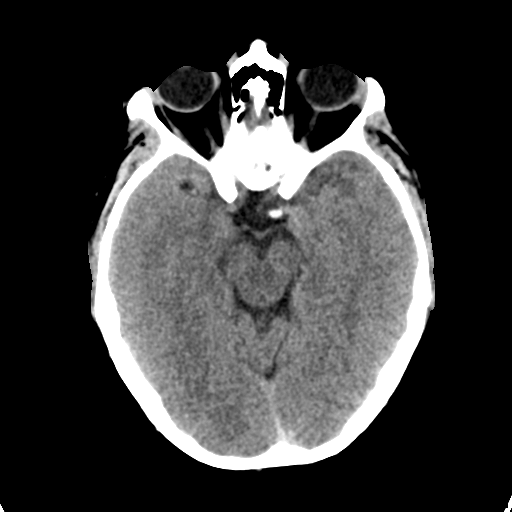
[im 16/31  brain]
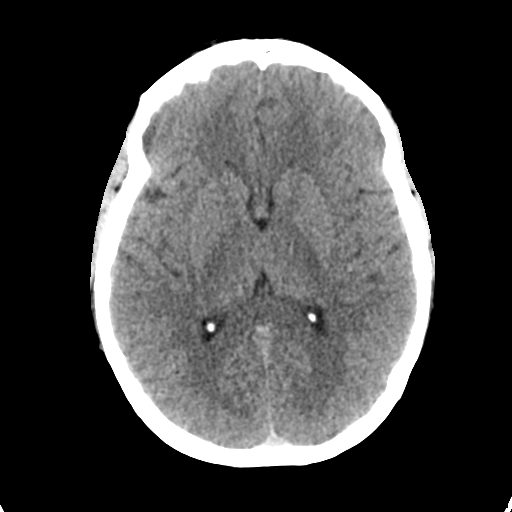
[im 19/31  brain]
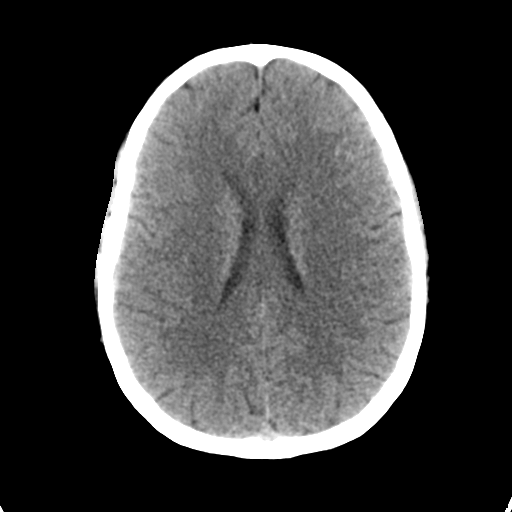
[im 19/31  bone]
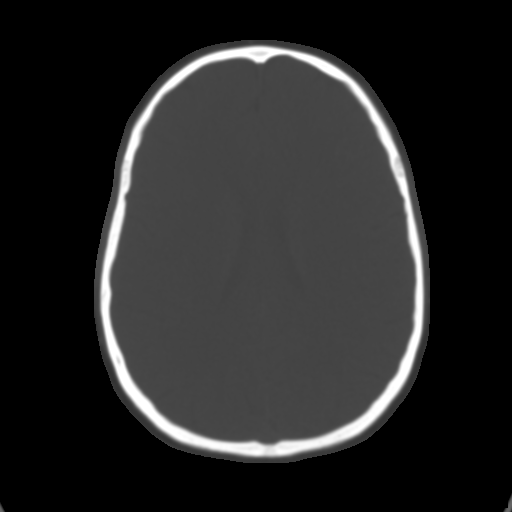
[im 23/31  brain]
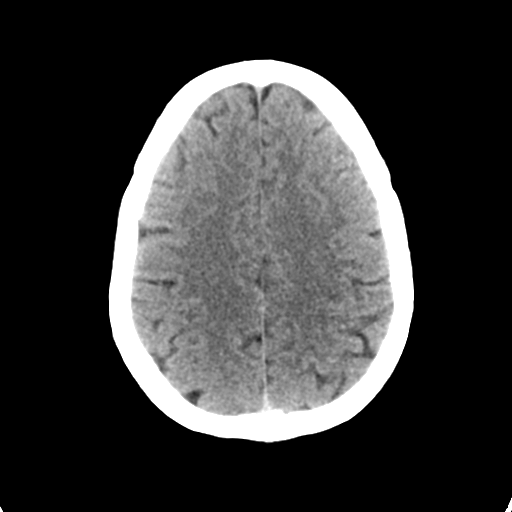
[im 27/31  brain]
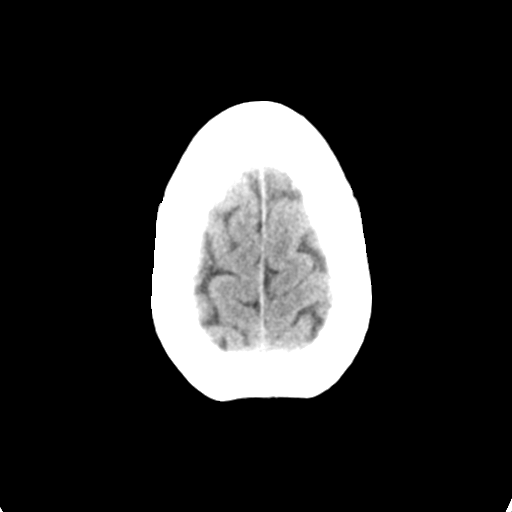

[Series 4: head bone · axial · 0.40mm/px · z∈[-86,-56]mm · 3 of 77 slices shown]
[im 8/77  bone]
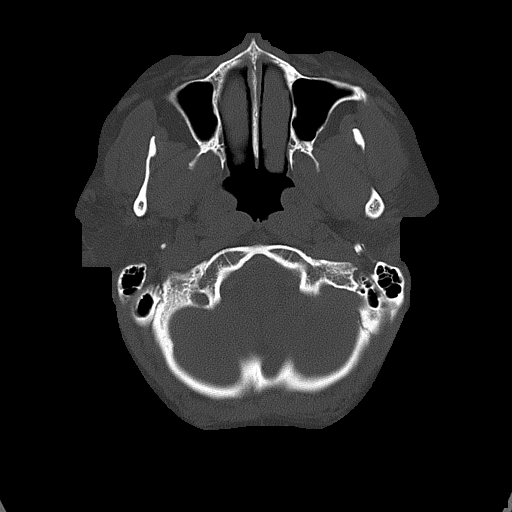
[im 16/77  bone]
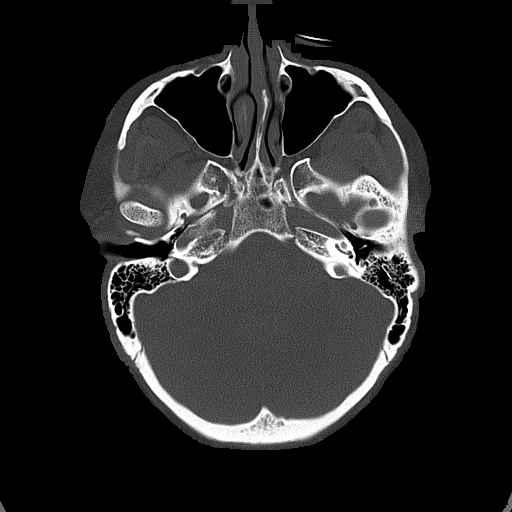
[im 23/77  bone]
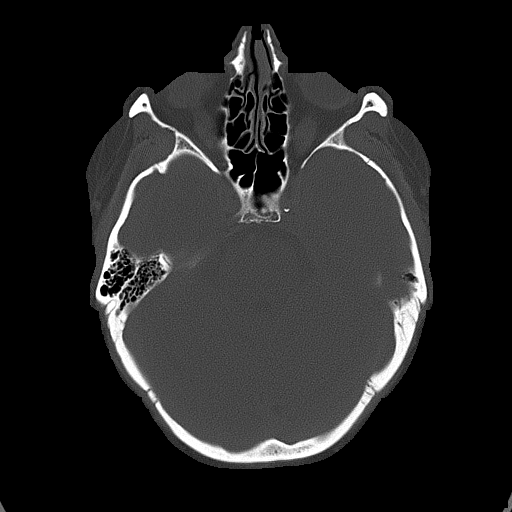

[Series 5: head without cor · coronal · non-contrast · 0.29mm/px · 3 of 69 slices shown]
[im 23/69  brain]
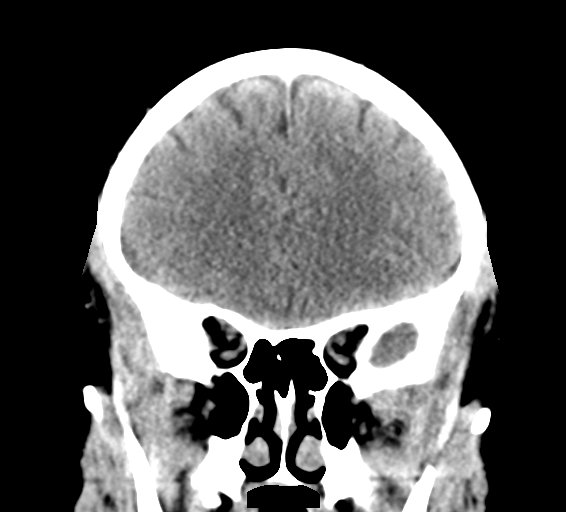
[im 31/69  brain]
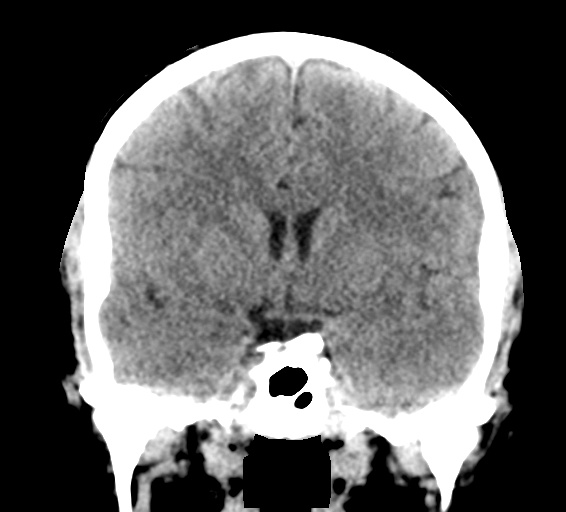
[im 38/69  brain]
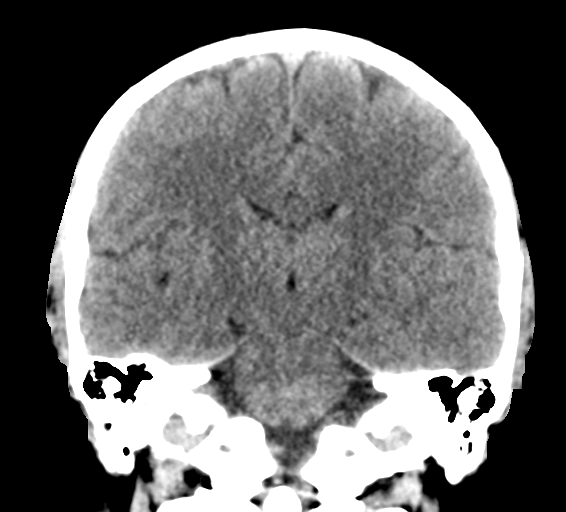

[Series 6: head without sag · sagittal · non-contrast · 0.30mm/px · 3 of 67 slices shown]
[im 23/67  brain]
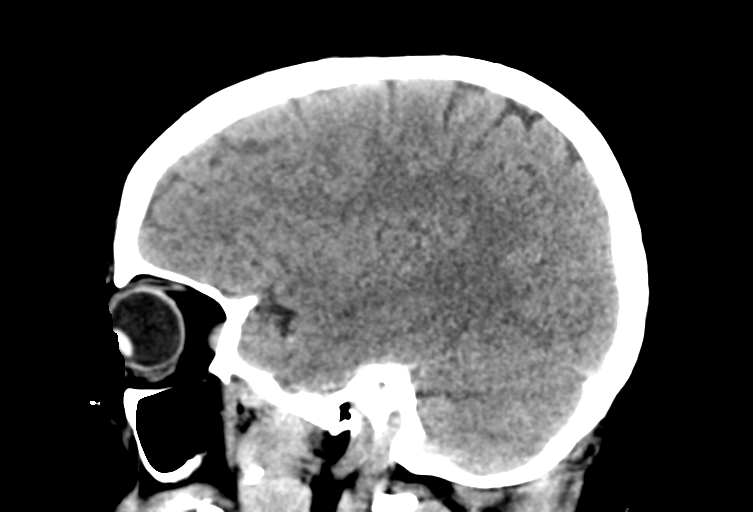
[im 34/67  brain]
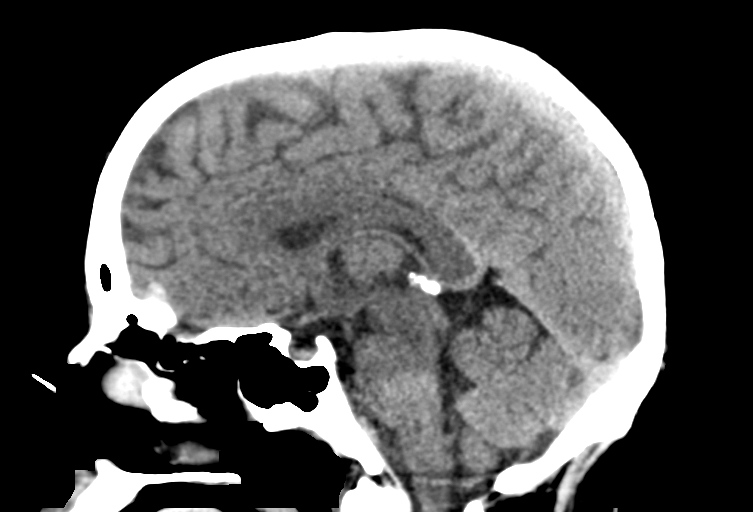
[im 45/67  brain]
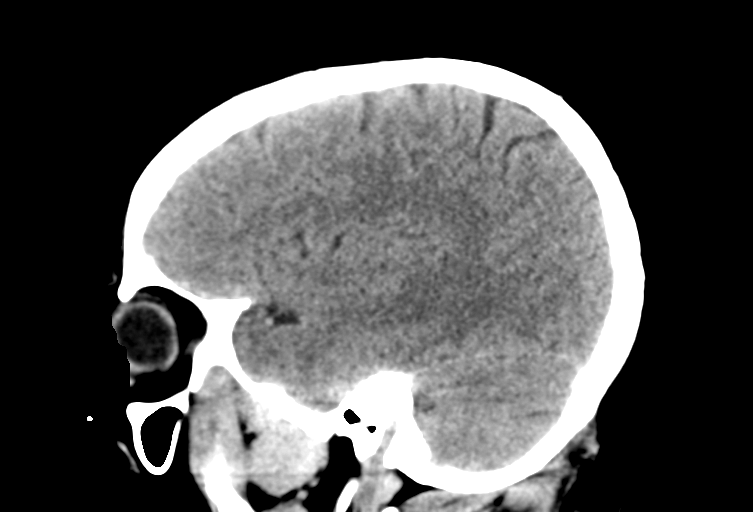

[16 of 47 positions shown; findings below may reference images not displayed]

FINDINGS: Brain: The brainstem, cerebellum, cerebral peduncles, thalami, basal
ganglia, basilar cisterns, and ventricular system appear within
normal limits. No intracranial hemorrhage, mass lesion, or acute
CVA.

Vascular: Unremarkable

Skull: Unremarkable

Sinuses/Orbits: Unremarkable

Other: No supplemental non-categorized findings.
IMPRESSION: 1.  No significant abnormality identified.

## 2021-08-15 ENCOUNTER — Other Ambulatory Visit (HOSPITAL_COMMUNITY): Payer: Self-pay

## 2021-08-23 ENCOUNTER — Telehealth: Payer: 59 | Admitting: Physician Assistant

## 2021-08-23 ENCOUNTER — Other Ambulatory Visit (HOSPITAL_COMMUNITY): Payer: Self-pay

## 2021-08-23 DIAGNOSIS — U071 COVID-19: Secondary | ICD-10-CM | POA: Diagnosis not present

## 2021-08-23 MED ORDER — NIRMATRELVIR/RITONAVIR (PAXLOVID)TABLET
3.0000 | ORAL_TABLET | Freq: Two times a day (BID) | ORAL | 0 refills | Status: AC
  Filled 2021-08-23: qty 30, 5d supply, fill #0

## 2021-08-23 MED ORDER — BENZONATATE 100 MG PO CAPS
100.0000 mg | ORAL_CAPSULE | Freq: Three times a day (TID) | ORAL | 0 refills | Status: DC | PRN
  Filled 2021-08-23: qty 30, 10d supply, fill #0

## 2021-08-23 NOTE — Progress Notes (Signed)
Based on the information that you have shared in the e-Visit Questionnaire, we recommend that you convert this visit to a video visit in order for the provider to better assess what is going on.  The provider will be able to give you a more accurate diagnosis and treatment plan if we can more freely discuss your symptoms and with the addition of a virtual examination.   Giving your positive test and your cardiac history, you would be a candidate for antiviral medications which we cannot prescribe via e-visit.   If you convert to a video visit, we will bill your insurance (similar to an office visit) and you will not be charged for this e-Visit. You will be able to stay at home and speak with the first available [facility] advanced practice provider. The link to do a video visit is in the drop down Menu tab of your Welcome screen in [place].

## 2021-08-23 NOTE — Patient Instructions (Signed)
Martha Nelson, thank you for joining [name], PA-C for today's virtual visit.  While this provider is not your primary care provider (PCP), if your PCP is located in our provider database this encounter information will be shared with them immediately following your visit.  Consent: (Patient) Martha Nelson provided verbal consent for this virtual visit at the beginning of the encounter.  Current Medications:  Current Outpatient Medications:    chlorthalidone (HYGROTON) 25 MG tablet, Take 1 tablet (25 mg total) by mouth daily., Disp: 30 tablet, Rfl: 11   Insulin Pen Needle 32G X 4 MM MISC, Use daily as directed, Disp: 100 each, Rfl: 1   isosorbide mononitrate (IMDUR) 30 MG 24 hr tablet, Take 1 tablet (30 mg total) by mouth daily. (Patient not taking: Reported on 05/09/2019), Disp: 30 tablet, Rfl: 5   Liraglutide -Weight Management (SAXENDA) 18 MG/3ML SOPN, Inject 0.6 mg daily for one week. Then increase by 0.6 mg weekly until injecting 3 mg daily., Disp: 15 mL, Rfl: 2   metoprolol tartrate (LOPRESSOR) 25 MG tablet, Take 1 tablet (25 mg total) by mouth 2 (two) times daily., Disp: 180 tablet, Rfl: 1   phentermine (ADIPEX-P) 37.5 MG tablet, Take 37.5 mg by mouth daily., Disp: , Rfl:    Medications ordered in this encounter:  No orders of the defined types were placed in this encounter.    *If you need refills on other medications prior to your next appointment, please contact your pharmacy*  Follow-Up: Call back or seek an in-person evaluation if the symptoms worsen or if the condition fails to improve as anticipated.  Other Instructions Please keep well-hydrated and get plenty of rest. Start a saline nasal rinse to flush out your nasal passages. You can use plain Mucinex to help thin congestion. If you have a humidifier, running in the bedroom at night. I want you to start OTC vitamin D3 1000 units daily, vitamin C 1000 mg daily, and a zinc supplement. Please take prescribed  medications as directed.  You have been enrolled in a MyChart symptom monitoring program. Please answer these questions daily so we can keep track of how you are doing.  You were to quarantine for 5 days from onset of your symptoms.  After day 5, if you have had no fever and you are feeling better, you can end quarantine but need to mask for an additional 5 days. After day 5 if you have a fever or are having significant symptoms, please quarantine for full 10 days.  If you note any worsening of symptoms, any significant shortness of breath or any chest pain, please seek ER evaluation ASAP.  Please do not delay care!  COVID-19: What to Do if You Are Sick If you test positive and are an older adult or someone who is at high risk of getting very sick from COVID-19, treatment may be available. Contact a healthcare provider right away after a positive test to determine if you are eligible, even if your symptoms are mild right now. You can also visit a Test to Treat location and, if eligible, receive a prescription from a provider. Don't delay: Treatment must be started within the first few days to be effective. If you have a fever, cough, or other symptoms, you might have COVID-19. Most people have mild illness and are able to recover at home. If you are sick: Keep track of your symptoms. If you have an emergency warning sign (including trouble breathing), call 911. Steps to help prevent  the spread of COVID-19 if you are sick If you are sick with COVID-19 or think you might have COVID-19, follow the steps below to care for yourself and to help protect other people in your home and community. Stay home except to get medical care Stay home. Most people with COVID-19 have mild illness and can recover at home without medical care. Do not leave your home, except to get medical care. Do not visit public areas and do not go to places where you are unable to wear a mask. Take care of yourself. Get rest and  stay hydrated. Take over-the-counter medicines, such as acetaminophen, to help you feel better. Stay in touch with your doctor. Call before you get medical care. Be sure to get care if you have trouble breathing, or have any other emergency warning signs, or if you think it is an emergency. Avoid public transportation, ride-sharing, or taxis if possible. Get tested If you have symptoms of COVID-19, get tested. While waiting for test results, stay away from others, including staying apart from those living in your household. Get tested as soon as possible after your symptoms start. Treatments may be available for people with COVID-19 who are at risk for becoming very sick. Don't delay: Treatment must be started early to be effective--some treatments must begin within 5 days of your first symptoms. Contact your healthcare provider right away if your test result is positive to determine if you are eligible. Self-tests are one of several options for testing for the virus that causes COVID-19 and may be more convenient than laboratory-based tests and point-of-care tests. Ask your healthcare provider or your local health department if you need help interpreting your test results. You can visit your state, tribal, local, and territorial health department's website to look for the latest local information on testing sites. Separate yourself from other people As much as possible, stay in a specific room and away from other people and pets in your home. If possible, you should use a separate bathroom. If you need to be around other people or animals in or outside of the home, wear a well-fitting mask. Tell your close contacts that they may have been exposed to COVID-19. An infected person can spread COVID-19 starting 48 hours (or 2 days) before the person has any symptoms or tests positive. By letting your close contacts know they may have been exposed to COVID-19, you are helping to protect everyone. See COVID-19  and Animals if you have questions about pets. If you are diagnosed with COVID-19, someone from the health department may call you. Answer the call to slow the spread. Monitor your symptoms Symptoms of COVID-19 include fever, cough, or other symptoms. Follow care instructions from your healthcare provider and local health department. Your local health authorities may give instructions on checking your symptoms and reporting information. When to seek emergency medical attention Look for emergency warning signs* for COVID-19. If someone is showing any of these signs, seek emergency medical care immediately: Trouble breathing Persistent pain or pressure in the chest New confusion Inability to wake or stay awake Pale, gray, or blue-colored skin, lips, or nail beds, depending on skin tone *This list is not all possible symptoms. Please call your medical provider for any other symptoms that are severe or concerning to you. Call 911 or call ahead to your local emergency facility: Notify the operator that you are seeking care for someone who has or may have COVID-19. Call ahead before visiting your doctor Call ahead.  Many medical visits for routine care are being postponed or done by phone or telemedicine. If you have a medical appointment that cannot be postponed, call your doctor's office, and tell them you have or may have COVID-19. This will help the office protect themselves and other patients. If you are sick, wear a well-fitting mask You should wear a mask if you must be around other people or animals, including pets (even at home). Wear a mask with the best fit, protection, and comfort for you. You don't need to wear the mask if you are alone. If you can't put on a mask (because of trouble breathing, for example), cover your coughs and sneezes in some other way. Try to stay at least 6 feet away from other people. This will help protect the people around you. Masks should not be placed on young  children under age 31 years, anyone who has trouble breathing, or anyone who is not able to remove the mask without help. Cover your coughs and sneezes Cover your mouth and nose with a tissue when you cough or sneeze. Throw away used tissues in a lined trash can. Immediately wash your hands with soap and water for at least 20 seconds. If soap and water are not available, clean your hands with an alcohol-based hand sanitizer that contains at least 60% alcohol. Clean your hands often Wash your hands often with soap and water for at least 20 seconds. This is especially important after blowing your nose, coughing, or sneezing; going to the bathroom; and before eating or preparing food. Use hand sanitizer if soap and water are not available. Use an alcohol-based hand sanitizer with at least 60% alcohol, covering all surfaces of your hands and rubbing them together until they feel dry. Soap and water are the best option, especially if hands are visibly dirty. Avoid touching your eyes, nose, and mouth with unwashed hands. Handwashing Tips Avoid sharing personal household items Do not share dishes, drinking glasses, cups, eating utensils, towels, or bedding with other people in your home. Wash these items thoroughly after using them with soap and water or put in the dishwasher. Clean surfaces in your home regularly Clean and disinfect high-touch surfaces (for example, doorknobs, tables, handles, light switches, and countertops) in your "sick room" and bathroom. In shared spaces, you should clean and disinfect surfaces and items after each use by the person who is ill. If you are sick and cannot clean, a caregiver or other person should only clean and disinfect the area around you (such as your bedroom and bathroom) on an as needed basis. Your caregiver/other person should wait as long as possible (at least several hours) and wear a mask before entering, cleaning, and disinfecting shared spaces that you  use. Clean and disinfect areas that may have blood, stool, or body fluids on them. Use household cleaners and disinfectants. Clean visible dirty surfaces with household cleaners containing soap or detergent. Then, use a household disinfectant. Use a product from [employer] List N: Disinfectants for Coronavirus (XNATF-57). Be sure to follow the instructions on the label to ensure safe and effective use of the product. Many products recommend keeping the surface wet with a disinfectant for a certain period of time (look at "contact time" on the product label). You may also need to wear personal protective equipment, such as gloves, depending on the directions on the product label. Immediately after disinfecting, wash your hands with soap and water for 20 seconds. For completed guidance on cleaning and disinfecting  your home, visit Complete Disinfection Guidance. Take steps to improve ventilation at home Improve ventilation (air flow) at home to help prevent from spreading COVID-19 to other people in your household. Clear out COVID-19 virus particles in the air by opening windows, using air filters, and turning on fans in your home. Use this interactive tool to learn how to improve air flow in your home. When you can be around others after being sick with COVID-19 Deciding when you can be around others is different for different situations. Find out when you can safely end home isolation. For any additional questions about your care, contact your healthcare provider or state or local health department. 11/29/2020 Content source: [facility] ([facility]), Division of Viral Diseases This information is not intended to replace advice given to you by your health care provider. Make sure you discuss any questions you have with your health care provider. Document Revised: 01/12/2021 Document Reviewed: 01/12/2021 Elsevier Patient Education  2022 [employer].      If you have been instructed to have an in-person evaluation today at a local Urgent Care facility, please use the link below. It will take you to a list of all of our available [facility], including address, phone number and hours of operation. Please do not delay care.  [facility]  If you or a family member do not have a primary care provider, use the link below to schedule a visit and establish care. When you choose a [facility] primary care physician or advanced practice provider, you gain a long-term partner in health. Find a Primary Care Provider  Learn more about [facility]'s in-office and virtual care options: [facility]

## 2021-08-23 NOTE — Progress Notes (Signed)
Virtual Visit Consent   Martha Nelson, you are scheduled for a virtual visit with a [facility] provider today.     Just as with appointments in the office, your consent must be obtained to participate.  Your consent will be active for this visit and any virtual visit you may have with one of our providers in the next 365 days.     If you have a MyChart account, a copy of this consent can be sent to you electronically.  All virtual visits are billed to your insurance company just like a traditional visit in the office.    As this is a virtual visit, video technology does not allow for your provider to perform a traditional examination.  This may limit your provider's ability to fully assess your condition.  If your provider identifies any concerns that need to be evaluated in person or the need to arrange testing (such as labs, EKG, etc.), we will make arrangements to do so.     Although advances in technology are sophisticated, we cannot ensure that it will always work on either your end or our end.  If the connection with a video visit is poor, the visit may have to be switched to a telephone visit.  With either a video or telephone visit, we are not always able to ensure that we have a secure connection.     I need to obtain your verbal consent now.   Are you willing to proceed with your visit today?    [name] has provided verbal consent on 08/23/2021 for a virtual visit (video or telephone).   Martha Nelson, [place]   Date: 08/23/2021 2:27 PM   Virtual Visit via Video Note   I, Martha Nelson, connected with  Martha Nelson  ([id number], 06-24-1976) on 08/23/21 at  2:30 PM EST by a video-enabled telemedicine application and verified that I am speaking with the correct person using two identifiers.  Location: Patient: Virtual Visit Location Patient: Home Provider: Virtual Visit Location Provider: Home Office   I discussed the limitations of evaluation and management by  telemedicine and the availability of in person appointments. The patient expressed understanding and agreed to proceed.    History of Present Illness: Martha Nelson is a 45 y.o. who identifies as a female who was assigned female at birth, and is being seen today for COVID-19. Notes some mld fatigue yesterday evening but this morning having all of her other symptoms starting -- nasal/head congestion, chest congestion, cough that is sometimes productive of clear sputum, fever (Tmax 101), body aches and headaches. Also noting some very mild windedness with coughing and more significant exertion.  Has taken Mucinex, Dayquil, Nasacort and Zyrtec. Tested positive with at-home test this morning.   HPI: HPI  Problems:  Patient Active Problem List   Diagnosis Date Noted   NSTEMI (non-ST elevated myocardial infarction) ([place]) 05/30/2017    Allergies: No Known Allergies Medications:  Current Outpatient Medications:    chlorthalidone (HYGROTON) 25 MG tablet, Take 1 tablet (25 mg total) by mouth daily., Disp: 30 tablet, Rfl: 11   Insulin Pen Needle 32G X 4 MM MISC, Use daily as directed, Disp: 100 each, Rfl: 1   isosorbide mononitrate (IMDUR) 30 MG 24 hr tablet, Take 1 tablet (30 mg total) by mouth daily. (Patient not taking: Reported on 05/09/2019), Disp: 30 tablet, Rfl: 5   Liraglutide -Weight Management (SAXENDA) 18 MG/3ML SOPN, Inject 0.6 mg daily for one week. Then increase by 0.6  mg weekly until injecting 3 mg daily., Disp: 15 mL, Rfl: 2   metoprolol tartrate (LOPRESSOR) 25 MG tablet, Take 1 tablet (25 mg total) by mouth 2 (two) times daily., Disp: 180 tablet, Rfl: 1   phentermine (ADIPEX-P) 37.5 MG tablet, Take 37.5 mg by mouth daily., Disp: , Rfl:   Observations/Objective: Patient is well-developed, well-nourished in no acute distress.  Resting comfortably at home.  Head is normocephalic, atraumatic.  No labored breathing. Speech is clear and coherent with logical content.  Patient is alert and  oriented at baseline.   Assessment and Plan: There are no diagnoses linked to this encounter. Patient with multiple risk factors for complicated course of illness. Discussed risks/benefits of antiviral medications including most common potential ADRs. Patient voiced understanding and would like to proceed with antiviral medication. They are candidate for Paxlovid. Last GFR > 90 with normal creatinine from October of this year. Rx sent to pharmacy. Supportive measures, OTC medications and vitamin regimen reviewed. Tessalon per orders. Patient has been enrolled in a MyChart COVID symptom monitoring program. Samule Dry reviewed in detail. Strict ER precautions discussed with patient.    Follow Up Instructions: I discussed the assessment and treatment plan with the patient. The patient was provided an opportunity to ask questions and all were answered. The patient agreed with the plan and demonstrated an understanding of the instructions.  A copy of instructions were sent to the patient via MyChart unless otherwise noted below.   The patient was advised to call back or seek an in-person evaluation if the symptoms worsen or if the condition fails to improve as anticipated.  Time:  I spent 12 minutes with the patient via telehealth technology discussing the above problems/concerns.    Martha Rio, PA-C

## 2021-12-12 ENCOUNTER — Ambulatory Visit: Payer: 59 | Admitting: Neurology

## 2021-12-12 ENCOUNTER — Encounter: Payer: Self-pay | Admitting: Neurology

## 2021-12-12 VITALS — BP 138/85 | HR 88 | Ht 64.0 in | Wt 214.5 lb

## 2021-12-12 DIAGNOSIS — Z79899 Other long term (current) drug therapy: Secondary | ICD-10-CM | POA: Diagnosis not present

## 2021-12-12 DIAGNOSIS — G35 Multiple sclerosis: Secondary | ICD-10-CM | POA: Diagnosis not present

## 2021-12-12 DIAGNOSIS — R269 Unspecified abnormalities of gait and mobility: Secondary | ICD-10-CM

## 2021-12-12 NOTE — Progress Notes (Addendum)
? ?[facility] ? ?PATIENT: Martha Nelson ?DOB: May 16, 1976 ? ?REFERRING DOCTOR OR PCP: Linward Natal, PA-C ?SOURCE: Patient, notes from Dr. Doy Hutching, imaging and lab reports, MRI images personally reviewed. ? ?_________________________________ ? ? ?HISTORICAL ? ?CHIEF COMPLAINT:  ?Chief Complaint  ?Patient presents with  ? New Patient (Initial Visit)  ?  Rm 1, alone. TOC for MS. Was on Zeposia but d/c due to severe lymphopenia. Not having any new sx.   ? ? ?HISTORY OF PRESENT ILLNESS:  ?I had the pleasure to see your patient, Martha Nelson, at the [place] [facility] for neurologic consultation regarding her recently diagnosed multiple sclerosis and treatment options. ? ?She is a 46 year old woman who had the onset of neurologic symptoms in 2021 and was diagnosed with MS in early 2022.  She had been on Zeposia but has not been on any medication since the first week of March 2023 ? ?In November 2021 she had weakness in her left leg x 43month   She noted her leg gave out multiple times.   She improved a week later.    In December 2021, she had similar symptoms on the right side involving her right leg was giving out and she had tingling up her spine.    She also had vertigo.  These spells seemed to come on for 30-60 seconds and then she would feel better.   These occurred multiple times a day.    In January 2022, she received a course of steroids and had no further episodes   She had a lumbar spine MRI showing L5S disc protrusion and then an MRI of the brain 10/07/2020 showing a left cerebellum and left pons focus.     There was possble trace enhancement on contrasted 11/01/2020 MRi of the brain.      ? ?In retrospect she had visual blurring with reduced  in one eye in 2019 x 1 day.   She does not recall if colors were desaturated as events lasted just one day and she felt baseline the next day.   ? ?She was referred to Dr. FDoy Hutchingand had a lumbar puncture  12/01/2021 showing 10  oligoclonal bands.  She was started on Zeposia but lymphocytes were very low (0.2) so dose was changed to qod.  Lymphocytes were still low and she developed Covid.    She then [facility]     She has been off a DMT since November 14, 2021.     ? ?Currently, she feels baseline and could walk several miles.   She holds the bannister since the symptoms in 07/2020 (she stumbled on stairs a few times then).    She denies weakness, numbness, tingling, or coordination.   She denies current visual symptoms or bladder issues.   ? ?She had some fatigue when she had Covid but feels baseline now.  She occasional has some word finding issues..   She denies mood issues.   She sleeps well. ? ?She has Hypertension (on chlorthalidone and metoprolol.   ? ?Images personally reviewed:  ?MRI of the head 10/07/2020 shows a 7 mm ovoid focus in the left cerebellar hemisphere adjacent to the middle cerebellar peduncle.  There is a possible smaller focus in the pons.  Only a few punctate nonspecific foci are noted in the hemisphere.  A repeat study with contrast was performed 11/01/2020 and the radiologist felt there may have been a trace of enhancement for that lesion. ? ?  MRI of the cervical spine 11/01/2020 redemonstrated the cerebellar lesion.  In the spinal cord, on the sagittal STIR and T2 weighted images, there appeared to be a possible focus towards the right adjacent to C5 though it is not clearly seen on axial images and is thus possible artifact there are degenerative changes noted at C4-C5 causing mild spinal stenosis but no nerve root compression and at C5-C6 causing mild spinal stenosis and left foraminal narrowing with potential for left C6 nerve root compression and at C6-C7 causing mild spinal stenosis and left foraminal narrowing with potential for left C7 nerve root compression. ? ?Pertinent labs: ?CSF 12/01/2020 showed 10 oligoclonal bands in the CSF not present in the serum.   Myelin basic protein, NMO antibody were negative VDRL was negative ? ?Blood work 12/15/2020 varicella-zoster antibody is positive.  Stratify JCV antibody was negative ?12/01/2020: ? ? ?REVIEW OF SYSTEMS: ?Constitutional: No fevers, chills, sweats, or change in appetite ?Eyes: No visual changes, double vision, eye pain ?Ear, nose and throat: No hearing loss, ear pain, nasal congestion, sore throat ?Cardiovascular: No chest pain, palpitations ?Respiratory:  No shortness of breath at rest or with exertion.   No wheezes ?GastrointestinaI: No nausea, vomiting, diarrhea, abdominal pain, fecal incontinence ?Genitourinary:  No dysuria, urinary retention or frequency.  No nocturia. ?Musculoskeletal:  No neck pain, back pain ?Integumentary: No rash, pruritus, skin lesions ?Neurological: as above ?Psychiatric: No depression at this time.  No anxiety ?Endocrine: No palpitations, diaphoresis, change in appetite, change in weigh or increased thirst ?Hematologic/Lymphatic:  No anemia, purpura, petechiae. ?Allergic/Immunologic: No itchy/runny eyes, nasal congestion, recent allergic reactions, rashes ? ?ALLERGIES: ?No Known Allergies ? ?HOME MEDICATIONS: ? ?Current Outpatient Medications:  ?  chlorthalidone (HYGROTON) 25 MG tablet, Take 1 tablet (25 mg total) by mouth daily., Disp: 30 tablet, Rfl: 11 ?  Cholecalciferol 125 MCG (5000 UT) TABS, Take 1 tablet by mouth 3 (three) times a week., Disp: , Rfl:  ?  metoprolol tartrate (LOPRESSOR) 25 MG tablet, Take 1 tablet (25 mg total) by mouth 2 (two) times daily., Disp: 180 tablet, Rfl: 1 ?  Omeprazole 20 MG TBEC, Take 1 tablet by mouth as needed., Disp: , Rfl:  ? ?PAST MEDICAL HISTORY: ?Past Medical History:  ?Diagnosis Date  ? BMI 39.0-39.9,adult   ? Coronary artery spasm ([place])   ? Degenerative disc disease, cervical   ? GERD (gastroesophageal reflux disease)   ? Gestational diabetes   ? Hypertension   ? Multiple sclerosis ([place])   ? Obesity   ? Palpitation   ? Sinus tachycardia    ? ? ?PAST SURGICAL HISTORY: ?Past Surgical History:  ?Procedure Laterality Date  ? CESAREAN SECTION    ? CHOLECYSTECTOMY    ? LEFT HEART CATH AND CORONARY ANGIOGRAPHY N/A 05/30/2017  ? Procedure: LEFT HEART CATH AND CORONARY ANGIOGRAPHY;  Surgeon: Nelva Bush, MD;  Location: [facility];  Service: Cardiovascular;  Laterality: N/A;  ? TUBAL LIGATION    ? ? ?FAMILY HISTORY: ?Family History  ?Problem Relation Age of Onset  ? High blood pressure Mother   ? Depression Mother   ? Arthritis Mother   ? Hypertension Father   ? ? ?SOCIAL HISTORY: ? ?Social History  ? ?Socioeconomic History  ? Marital status: Married  ?  Spouse name: Jeneen Rinks  ? Number of children: 4  ? Years of education: Not on file  ? Highest education level: Master's degree (e.g., MA, MS, MEng, MEd, MSW, MBA)  ?Occupational History  ? Not on file  ?  Tobacco Use  ? Smoking status: Never  ? Smokeless tobacco: Never  ?Substance and Sexual Activity  ? Alcohol use: No  ? Drug use: No  ? Sexual activity: Not on file  ?Other Topics Concern  ? Not on file  ?Social History Narrative  ? Lives at home w husband and children  ? Right handed  ? Caffeine: 2 coke zero a day  ? ?Social Determinants of Health  ? ?Financial Resource Strain: Not on file  ?Food Insecurity: Not on file  ?Transportation Needs: Not on file  ?Physical Activity: Not on file  ?Stress: Not on file  ?Social Connections: Not on file  ?Intimate Partner Violence: Not on file  ? ? ? ?PHYSICAL EXAM ? ?Vitals:  ? 12/12/21 0853  ?BP: 138/85  ?Pulse: 88  ?Weight: 214 lb 8 oz (97.3 kg)  ?Height: '5\' 4"'$  (1.626 m)  ? ? ?Body mass index is 36.82 kg/m?. ? ? ?Vision Screening  ? Right eye Left eye Both eyes  ?Without correction '20/20 20/30 20/20 '$  ?With correction     ? ? ? ?General: The patient is well-developed and well-nourished and in no acute distress ? ?HEENT:  Head is [place]/AT.  Sclera are anicteric.  Funduscopic exam shows normal optic discs and retinal vessels. ? ?Neck: No carotid bruits are noted.   The neck is nontender. ? ?Cardiovascular: The heart has a regular rate and rhythm with a normal S1 and S2. There were no murmurs, gallops or rubs.   ? ?Skin: Extremities are without rash or  edema. ? ?Musculoskeletal:

## 2021-12-16 LAB — CBC WITH DIFFERENTIAL/PLATELET
Basophils Absolute: 0 10*3/uL (ref 0.0–0.2)
Basos: 0 %
EOS (ABSOLUTE): 0.2 10*3/uL (ref 0.0–0.4)
Eos: 2 %
Hematocrit: 40.1 % (ref 34.0–46.6)
Hemoglobin: 13.2 g/dL (ref 11.1–15.9)
Immature Grans (Abs): 0 10*3/uL (ref 0.0–0.1)
Immature Granulocytes: 0 %
Lymphocytes Absolute: 0.5 10*3/uL — ABNORMAL LOW (ref 0.7–3.1)
Lymphs: 7 %
MCH: 28.8 pg (ref 26.6–33.0)
MCHC: 32.9 g/dL (ref 31.5–35.7)
MCV: 88 fL (ref 79–97)
Monocytes Absolute: 0.6 10*3/uL (ref 0.1–0.9)
Monocytes: 8 %
Neutrophils Absolute: 6.1 10*3/uL (ref 1.4–7.0)
Neutrophils: 83 %
Platelets: 250 10*3/uL (ref 150–450)
RBC: 4.58 x10E6/uL (ref 3.77–5.28)
RDW: 13.7 % (ref 11.7–15.4)
WBC: 7.4 10*3/uL (ref 3.4–10.8)

## 2021-12-16 LAB — QUANTIFERON-TB GOLD PLUS
QuantiFERON Mitogen Value: >10 IU/mL
QuantiFERON Nil Value: 0 IU/mL
QuantiFERON TB1 Ag Value: 0.01 IU/mL
QuantiFERON TB2 Ag Value: 0.01 IU/mL
QuantiFERON-TB Gold Plus: NEGATIVE

## 2021-12-18 ENCOUNTER — Telehealth: Payer: Self-pay | Admitting: Neurology

## 2021-12-18 DIAGNOSIS — G35 Multiple sclerosis: Secondary | ICD-10-CM

## 2021-12-18 DIAGNOSIS — Z79899 Other long term (current) drug therapy: Secondary | ICD-10-CM

## 2021-12-18 NOTE — Telephone Encounter (Signed)
I spoke to Martha Nelson.  The lymphocyte counts were still low.  Of note, it was about 3 days off of Zeposia when the blood count was done last week.  We will recheck the CBC with differential next week.  She will come into the office.  Based on results further recommendations will be made. ?

## 2021-12-18 NOTE — Telephone Encounter (Signed)
I had called to let her know that the lymphocyte count was still low (it was very low on Zeposia and is better but not yet back at baseline) it would most likely be back to baseline within another few weeks.  I will go over options with her. ? ?The TB test was also done today and was negative ?

## 2022-01-01 ENCOUNTER — Other Ambulatory Visit (INDEPENDENT_AMBULATORY_CARE_PROVIDER_SITE_OTHER): Payer: Self-pay

## 2022-01-01 DIAGNOSIS — Z79899 Other long term (current) drug therapy: Secondary | ICD-10-CM

## 2022-01-01 DIAGNOSIS — G35 Multiple sclerosis: Secondary | ICD-10-CM

## 2022-01-01 DIAGNOSIS — Z0289 Encounter for other administrative examinations: Secondary | ICD-10-CM

## 2022-01-02 ENCOUNTER — Telehealth: Payer: Self-pay | Admitting: Neurology

## 2022-01-02 LAB — CBC WITH DIFFERENTIAL/PLATELET
Basophils Absolute: 0 10*3/uL (ref 0.0–0.2)
Basos: 0 %
EOS (ABSOLUTE): 0.1 10*3/uL (ref 0.0–0.4)
Eos: 2 %
Hematocrit: 36.7 % (ref 34.0–46.6)
Hemoglobin: 12.5 g/dL (ref 11.1–15.9)
Immature Grans (Abs): 0 10*3/uL (ref 0.0–0.1)
Immature Granulocytes: 0 %
Lymphocytes Absolute: 0.8 10*3/uL (ref 0.7–3.1)
Lymphs: 13 %
MCH: 29.6 pg (ref 26.6–33.0)
MCHC: 34.1 g/dL (ref 31.5–35.7)
MCV: 87 fL (ref 79–97)
Monocytes Absolute: 0.4 10*3/uL (ref 0.1–0.9)
Monocytes: 7 %
Neutrophils Absolute: 4.6 10*3/uL (ref 1.4–7.0)
Neutrophils: 78 %
Platelets: 233 10*3/uL (ref 150–450)
RBC: 4.23 x10E6/uL (ref 3.77–5.28)
RDW: 13.4 % (ref 11.7–15.4)
WBC: 6 10*3/uL (ref 3.4–10.8)

## 2022-01-02 NOTE — Telephone Encounter (Signed)
I spoke to Serafina Royals about the lab results.  Her lymphocyte count is now back within normal range.  Lymphocytes have been low with Zeposia but as expected 6 to 8 weeks after stopping the medicine they are back in the normal range ? ?Therefore, we can get her on a DMT.  We discussed options.  She is most interested in the [facility] study comparing fenebrutinib to Aubagio ? ?I will let the research staff know and they will contact her to schedule her screening.  The protocol has an exclusion  ?"Previous use of fingolimod, siponimod, or ozanimod within 8 weeks of randomization or ponesimod within 4 weeks of randomization".  8 weeks will occur during the first week of May so analyzation will be more than 8 weeks later. ? ?We also discussed that if she does not pass the screen that I will get her started on Aubagio ?

## 2022-01-18 ENCOUNTER — Encounter: Payer: Self-pay | Admitting: Neurology

## 2022-01-22 NOTE — Telephone Encounter (Signed)
Aubagio Start Form placed at the front desk for signature. ?

## 2022-01-23 ENCOUNTER — Telehealth: Payer: Self-pay

## 2022-01-23 ENCOUNTER — Other Ambulatory Visit (HOSPITAL_COMMUNITY): Payer: Self-pay

## 2022-01-23 DIAGNOSIS — G35 Multiple sclerosis: Secondary | ICD-10-CM

## 2022-01-23 DIAGNOSIS — Z79899 Other long term (current) drug therapy: Secondary | ICD-10-CM

## 2022-01-23 MED ORDER — TERIFLUNOMIDE 14 MG PO TABS
1.0000 | ORAL_TABLET | Freq: Every day | ORAL | 5 refills | Status: DC
  Filled 2022-01-23: qty 30, 30d supply, fill #0

## 2022-01-23 NOTE — Telephone Encounter (Signed)
Dr. Felecia Shelling completed Martha Nelson start form. Faxed to Aubagio [facility] [employer] [facility]. Received a receipt of confirmation. ? ?Completed aubagio PA via CMM. Sent to [place]. Should havea determination within 3-5 business days. Key: [id number]. ? ?

## 2022-01-23 NOTE — Telephone Encounter (Signed)
Teriflunomide RX sent to [place]. Completed teriflunomide PA via CMM. Sent to [place]. Should have a determination within 3-5 business days. Key: [id number]. ?

## 2022-01-23 NOTE — Telephone Encounter (Signed)
PA for teriflunomide was approved by [facility]: "Request Reference Number: [id number]. TERIFLUNOMID TAB '14MG'$  is approved through 01/24/2023. Your patient may now fill this prescription and it will be covered." ?

## 2022-01-23 NOTE — Telephone Encounter (Signed)
The PA for Aubagio was denied by [facility] because it is not a covered benefit. ? ?I spoke to Dr. Felecia Shelling.  We can try generic Aubagio for her.  If the generic Rogelia Rohrer is still too expensive we may need to switch her to another DMT.  I will send patient a MyChart message and make sure she is agreeable to this plan. ?

## 2022-01-26 ENCOUNTER — Telehealth: Payer: Self-pay | Admitting: Neurology

## 2022-01-26 ENCOUNTER — Other Ambulatory Visit (HOSPITAL_COMMUNITY): Payer: Self-pay

## 2022-01-26 DIAGNOSIS — G35 Multiple sclerosis: Secondary | ICD-10-CM

## 2022-01-26 NOTE — Telephone Encounter (Signed)
Pt is requesting a refill for Teriflunomide 14 MG TABS.  Pharmacy: [place]

## 2022-01-29 MED ORDER — TERIFLUNOMIDE 14 MG PO TABS: 1.0000 | ORAL_TABLET | Freq: Every day | ORAL | 5 refills | Status: DC

## 2022-01-29 NOTE — Addendum Note (Signed)
Addended by: Wyvonnia Lora on: 01/29/2022 08:11 AM   Modules accepted: Orders

## 2022-01-29 NOTE — Telephone Encounter (Signed)
Martha Nelson is not a covered benefit by patient's insurance.  We tried to switch her to teriflunomide but she also has a $100 co-pay.  There is no patient assistance offered for teriflunomide nor Aubagio for this patient at this time.  She is interested in other options that may be more affordable.  She could not participate in the drug study.  I will send this to Dr. Felecia Shelling for further review and consideration.

## 2022-01-29 NOTE — Telephone Encounter (Signed)
E-scribed rx to [facility] pharmacy as requested.

## 2022-01-30 MED ORDER — TERIFLUNOMIDE 7 MG PO TABS: 14.0000 mg | ORAL_TABLET | Freq: Every day | ORAL | 3 refills | Status: DC

## 2022-01-30 NOTE — Addendum Note (Signed)
Addended by: Lester [place] A on: 01/30/2022 10:55 AM   Modules accepted: Orders

## 2022-01-30 NOTE — Telephone Encounter (Signed)
Per Dr. Felecia Shelling: "[facility] now has Aubagio generic and it is very affordable.  It is cash only.  We can send in 90 pills with refills and it looks like it would only cost her $31 every 3 months   [email or web address]://[email or web address]-'14mg'$ -tablet/   Alternatively, we can start dimethyl fumarate if she would rather try different medicine."  Unfortunately, [facility] does not have teriflunomide '14mg'$  tablets but it does carry the '7mg'$ . I spoke with Dr. Felecia Shelling. He said she could take 2 of the teriflunomide '7mg'$  tablets or she can consider dimethyl fumarate. I will send her a mychart message.

## 2022-02-27 ENCOUNTER — Telehealth: Payer: 59 | Admitting: Family Medicine

## 2022-02-27 ENCOUNTER — Other Ambulatory Visit (HOSPITAL_COMMUNITY): Payer: Self-pay

## 2022-02-27 DIAGNOSIS — U071 COVID-19: Secondary | ICD-10-CM

## 2022-02-27 MED ORDER — NIRMATRELVIR/RITONAVIR (PAXLOVID)TABLET
3.0000 | ORAL_TABLET | Freq: Two times a day (BID) | ORAL | 0 refills | Status: AC
  Filled 2022-02-27: qty 30, 5d supply, fill #0

## 2022-02-27 NOTE — Progress Notes (Signed)
Virtual Visit Consent   Martha Nelson, you are scheduled for a virtual visit with a [facility] provider today. Just as with appointments in the office, your consent must be obtained to participate. Your consent will be active for this visit and any virtual visit you may have with one of our providers in the next 365 days. If you have a MyChart account, a copy of this consent can be sent to you electronically.  As this is a virtual visit, video technology does not allow for your provider to perform a traditional examination. This may limit your provider's ability to fully assess your condition. If your provider identifies any concerns that need to be evaluated in person or the need to arrange testing (such as labs, EKG, etc.), we will make arrangements to do so. Although advances in technology are sophisticated, we cannot ensure that it will always work on either your end or our end. If the connection with a video visit is poor, the visit may have to be switched to a telephone visit. With either a video or telephone visit, we are not always able to ensure that we have a secure connection.  By engaging in this virtual visit, you consent to the provision of healthcare and authorize for your insurance to be billed (if applicable) for the services provided during this visit. Depending on your insurance coverage, you may receive a charge related to this service.  I need to obtain your verbal consent now. Are you willing to proceed with your visit today? Martha Nelson has provided verbal consent on 02/27/2022 for a virtual visit (video or telephone). Martha Mayo, NP  Date: 02/27/2022 3:33 PM  Virtual Visit via Video Note   I, Martha Nelson, connected with  Martha Nelson  ([id number], 1976-05-30) on 02/27/22 at  3:30 PM EDT by a video-enabled telemedicine application and verified that I am speaking with the correct person using two identifiers.  Location: Patient: Virtual Visit Location Patient:  Home Provider: Virtual Visit Location Provider: Home Office   I discussed the limitations of evaluation and management by telemedicine and the availability of in person appointments. The patient expressed understanding and agreed to proceed.    History of Present Illness: Martha Nelson is a 45 y.o. who identifies as a female who was assigned female at birth, and is being seen today for COVID 19, symptoms started yesterday with nasal congestion and URI signs and symptoms. Took home test this morning to be on safe side and it came back positive. Denies fevers, chills, chest pain, shortness of breath. Taken Dayquil, vitamins, zyrtec, and nasal spray.    Problems:  Patient Active Problem List   Diagnosis Date Noted   NSTEMI (non-ST elevated myocardial infarction) ([place]) 05/30/2017    Allergies: No Known Allergies Medications:  Current Outpatient Medications:    chlorthalidone (HYGROTON) 25 MG tablet, Take 1 tablet (25 mg total) by mouth daily., Disp: 30 tablet, Rfl: 11   Cholecalciferol 125 MCG (5000 UT) TABS, Take 1 tablet by mouth 3 (three) times a week., Disp: , Rfl:    metoprolol tartrate (LOPRESSOR) 25 MG tablet, Take 1 tablet (25 mg total) by mouth 2 (two) times daily., Disp: 180 tablet, Rfl: 1   Omeprazole 20 MG TBEC, Take 1 tablet by mouth as needed., Disp: , Rfl:    Teriflunomide 7 MG TABS, Take 14 mg by mouth daily., Disp: 180 tablet, Rfl: 3  Observations/Objective: Patient is well-developed, well-nourished in no acute distress.  Resting comfortably  at  home.  Head is normocephalic, atraumatic.  No labored breathing.  Speech is clear and coherent with logical content.  Patient is alert and oriented at baseline.    Assessment and Plan: 1. COVID-19  - nirmatrelvir/ritonavir EUA (PAXLOVID) 20 x 150 MG & 10 x '100MG'$  TABS; Take 3 tablets by mouth 2 (two) times daily for 5 days. (Take nirmatrelvir 150 mg two tablets twice daily for 5 days and ritonavir 100 mg one tablet twice daily for  5 days) Patient GFR is 60  Dispense: 30 tablet; Refill: 0  Discussed risks/benefits of antiviral medications including most common potential ADRs. Patient voiced understanding and would like to proceed with antiviral medication. They are candidate for Paxlovid. Last GFR > 60 Feb of this year. Rx sent to pharmacy. Supportive measures, OTC medications and vitamin regimen reviewed. Strict ER precautions discussed with patient.    Follow Up Instructions: I discussed the assessment and treatment plan with the patient. The patient was provided an opportunity to ask questions and all were answered. The patient agreed with the plan and demonstrated an understanding of the instructions.  A copy of instructions were sent to the patient via MyChart unless otherwise noted below.     The patient was advised to call back or seek an in-person evaluation if the symptoms worsen or if the condition fails to improve as anticipated.  Time:  I spent 15 minutes with the patient via telehealth technology discussing the above problems/concerns.    Martha Mayo, NP

## 2022-02-27 NOTE — Patient Instructions (Addendum)
Serafina Royals, thank you for joining [name], NP for today's virtual visit.  While this provider is not your primary care provider (PCP), if your PCP is located in our provider database this encounter information will be shared with them immediately following your visit.  Consent: (Patient) Martha Nelson provided verbal consent for this virtual visit at the beginning of the encounter.  Current Medications:  Current Outpatient Medications:    nirmatrelvir/ritonavir EUA (PAXLOVID) 20 x 150 MG & 10 x '100MG'$  TABS, Take 3 tablets by mouth 2 (two) times daily for 5 days. (Take nirmatrelvir 150 mg two tablets twice daily for 5 days and ritonavir 100 mg one tablet twice daily for 5 days) Patient GFR is 60, Disp: 30 tablet, Rfl: 0   chlorthalidone (HYGROTON) 25 MG tablet, Take 1 tablet (25 mg total) by mouth daily., Disp: 30 tablet, Rfl: 11   Cholecalciferol 125 MCG (5000 UT) TABS, Take 1 tablet by mouth 3 (three) times a week., Disp: , Rfl:    metoprolol tartrate (LOPRESSOR) 25 MG tablet, Take 1 tablet (25 mg total) by mouth 2 (two) times daily., Disp: 180 tablet, Rfl: 1   Omeprazole 20 MG TBEC, Take 1 tablet by mouth as needed., Disp: , Rfl:    Teriflunomide 7 MG TABS, Take 14 mg by mouth daily., Disp: 180 tablet, Rfl: 3   Medications ordered in this encounter:  Meds ordered this encounter  Medications   nirmatrelvir/ritonavir EUA (PAXLOVID) 20 x 150 MG & 10 x '100MG'$  TABS    Sig: Take 3 tablets by mouth 2 (two) times daily for 5 days. (Take nirmatrelvir 150 mg two tablets twice daily for 5 days and ritonavir 100 mg one tablet twice daily for 5 days) Patient GFR is 60    Dispense:  30 tablet    Refill:  0    Order Specific Question:   Supervising Provider    Answer:   Noemi Chapel [[id number]]     *If you need refills on other medications prior to your next appointment, please contact your pharmacy*  Follow-Up: Call back or seek an in-person evaluation if the symptoms worsen or if the condition  fails to improve as anticipated.  Other Instructions  Please keep well-hydrated and get plenty of rest. Start a saline nasal rinse to flush out your nasal passages. You can use plain Mucinex to help thin congestion. If you have a humidifier, running in the bedroom at night. I want you to start OTC vitamin D3 1000 units daily, vitamin C 1000 mg daily, and a zinc supplement. Please take prescribed medications as directed.  You have been enrolled in a MyChart symptom monitoring program. Please answer these questions daily so we can keep track of how you are doing.  You were to quarantine for 5 days from onset of your symptoms.  After day 5, if you have had no fever and you are feeling better, you can end quarantine but need to mask for an additional 5 days. After day 5 if you have a fever or are having significant symptoms, please quarantine for full 10 days.  If you note any worsening of symptoms, any significant shortness of breath or any chest pain, please seek ER evaluation ASAP.  Please do not delay care!  COVID-19: What to Do if You Are Sick If you test positive and are an older adult or someone who is at high risk of getting very sick from COVID-19, treatment may be available. Contact a healthcare provider right away  after a positive test to determine if you are eligible, even if your symptoms are mild right now. You can also visit a Test to Treat location and, if eligible, receive a prescription from a provider. Don't delay: Treatment must be started within the first few days to be effective. If you have a fever, cough, or other symptoms, you might have COVID-19. Most people have mild illness and are able to recover at home. If you are sick: Keep track of your symptoms. If you have an emergency warning sign (including trouble breathing), call 911. Steps to help prevent the spread of COVID-19 if you are sick If you are sick with COVID-19 or think you might have COVID-19, follow the steps  below to care for yourself and to help protect other people in your home and community. Stay home except to get medical care Stay home. Most people with COVID-19 have mild illness and can recover at home without medical care. Do not leave your home, except to get medical care. Do not visit public areas and do not go to places where you are unable to wear a mask. Take care of yourself. Get rest and stay hydrated. Take over-the-counter medicines, such as acetaminophen, to help you feel better. Stay in touch with your doctor. Call before you get medical care. Be sure to get care if you have trouble breathing, or have any other emergency warning signs, or if you think it is an emergency. Avoid public transportation, ride-sharing, or taxis if possible. Get tested If you have symptoms of COVID-19, get tested. While waiting for test results, stay away from others, including staying apart from those living in your household. Get tested as soon as possible after your symptoms start. Treatments may be available for people with COVID-19 who are at risk for becoming very sick. Don't delay: Treatment must be started early to be effective--some treatments must begin within 5 days of your first symptoms. Contact your healthcare provider right away if your test result is positive to determine if you are eligible. Self-tests are one of several options for testing for the virus that causes COVID-19 and may be more convenient than laboratory-based tests and point-of-care tests. Ask your healthcare provider or your local health department if you need help interpreting your test results. You can visit your state, tribal, local, and territorial health department's website to look for the latest local information on testing sites. Separate yourself from other people As much as possible, stay in a specific room and away from other people and pets in your home. If possible, you should use a separate bathroom. If you need to be  around other people or animals in or outside of the home, wear a well-fitting mask. Tell your close contacts that they may have been exposed to COVID-19. An infected person can spread COVID-19 starting 48 hours (or 2 days) before the person has any symptoms or tests positive. By letting your close contacts know they may have been exposed to COVID-19, you are helping to protect everyone. See COVID-19 and Animals if you have questions about pets. If you are diagnosed with COVID-19, someone from the health department may call you. Answer the call to slow the spread. Monitor your symptoms Symptoms of COVID-19 include fever, cough, or other symptoms. Follow care instructions from your healthcare provider and local health department. Your local health authorities may give instructions on checking your symptoms and reporting information. When to seek emergency medical attention Look for emergency warning signs* for COVID-19.  If someone is showing any of these signs, seek emergency medical care immediately: Trouble breathing Persistent pain or pressure in the chest New confusion Inability to wake or stay awake Pale, gray, or blue-colored skin, lips, or nail beds, depending on skin tone *This list is not all possible symptoms. Please call your medical provider for any other symptoms that are severe or concerning to you. Call 911 or call ahead to your local emergency facility: Notify the operator that you are seeking care for someone who has or may have COVID-19. Call ahead before visiting your doctor Call ahead. Many medical visits for routine care are being postponed or done by phone or telemedicine. If you have a medical appointment that cannot be postponed, call your doctor's office, and tell them you have or may have COVID-19. This will help the office protect themselves and other patients. If you are sick, wear a well-fitting mask You should wear a mask if you must be around other people or animals,  including pets (even at home). Wear a mask with the best fit, protection, and comfort for you. You don't need to wear the mask if you are alone. If you can't put on a mask (because of trouble breathing, for example), cover your coughs and sneezes in some other way. Try to stay at least 6 feet away from other people. This will help protect the people around you. Masks should not be placed on young children under age 42 years, anyone who has trouble breathing, or anyone who is not able to remove the mask without help. Cover your coughs and sneezes Cover your mouth and nose with a tissue when you cough or sneeze. Throw away used tissues in a lined trash can. Immediately wash your hands with soap and water for at least 20 seconds. If soap and water are not available, clean your hands with an alcohol-based hand sanitizer that contains at least 60% alcohol. Clean your hands often Wash your hands often with soap and water for at least 20 seconds. This is especially important after blowing your nose, coughing, or sneezing; going to the bathroom; and before eating or preparing food. Use hand sanitizer if soap and water are not available. Use an alcohol-based hand sanitizer with at least 60% alcohol, covering all surfaces of your hands and rubbing them together until they feel dry. Soap and water are the best option, especially if hands are visibly dirty. Avoid touching your eyes, nose, and mouth with unwashed hands. Handwashing Tips Avoid sharing personal household items Do not share dishes, drinking glasses, cups, eating utensils, towels, or bedding with other people in your home. Wash these items thoroughly after using them with soap and water or put in the dishwasher. Clean surfaces in your home regularly Clean and disinfect high-touch surfaces (for example, doorknobs, tables, handles, light switches, and countertops) in your "sick room" and bathroom. In shared spaces, you should clean and disinfect  surfaces and items after each use by the person who is ill. If you are sick and cannot clean, a caregiver or other person should only clean and disinfect the area around you (such as your bedroom and bathroom) on an as needed basis. Your caregiver/other person should wait as long as possible (at least several hours) and wear a mask before entering, cleaning, and disinfecting shared spaces that you use. Clean and disinfect areas that may have blood, stool, or body fluids on them. Use household cleaners and disinfectants. Clean visible dirty surfaces with household cleaners containing soap  or detergent. Then, use a household disinfectant. Use a product from [employer] List N: Disinfectants for Coronavirus (XBWIO-03). Be sure to follow the instructions on the label to ensure safe and effective use of the product. Many products recommend keeping the surface wet with a disinfectant for a certain period of time (look at "contact time" on the product label). You may also need to wear personal protective equipment, such as gloves, depending on the directions on the product label. Immediately after disinfecting, wash your hands with soap and water for 20 seconds. For completed guidance on cleaning and disinfecting your home, visit Complete Disinfection Guidance. Take steps to improve ventilation at home Improve ventilation (air flow) at home to help prevent from spreading COVID-19 to other people in your household. Clear out COVID-19 virus particles in the air by opening windows, using air filters, and turning on fans in your home. Use this interactive tool to learn how to improve air flow in your home. When you can be around others after being sick with COVID-19 Deciding when you can be around others is different for different situations. Find out when you can safely end home isolation. For any additional questions about your care, contact your healthcare provider or state or local health  department. 11/29/2020 Content source: [facility] ([facility]), Division of Viral Diseases This information is not intended to replace advice given to you by your health care provider. Make sure you discuss any questions you have with your health care provider. Document Revised: 01/12/2021 Document Reviewed: 01/12/2021 Elsevier Patient Education  2022 [employer].      If you have been instructed to have an in-person evaluation today at a local Urgent Care facility, please use the link below. It will take you to a list of all of our available [facility], including address, phone number and hours of operation. Please do not delay care.  [facility]  If you or a family member do not have a primary care provider, use the link below to schedule a visit and establish care. When you choose a [facility] primary care physician or advanced practice provider, you gain a long-term partner in health. Find a Primary Care Provider  Learn more about [facility] in-office and virtual care options: [facility]

## 2022-03-06 ENCOUNTER — Other Ambulatory Visit: Payer: 59

## 2022-03-06 ENCOUNTER — Other Ambulatory Visit (INDEPENDENT_AMBULATORY_CARE_PROVIDER_SITE_OTHER): Payer: Self-pay

## 2022-03-06 DIAGNOSIS — Z0289 Encounter for other administrative examinations: Secondary | ICD-10-CM

## 2022-03-06 DIAGNOSIS — Z79899 Other long term (current) drug therapy: Secondary | ICD-10-CM

## 2022-03-06 DIAGNOSIS — G35 Multiple sclerosis: Secondary | ICD-10-CM

## 2022-03-06 DIAGNOSIS — G35D Multiple sclerosis, unspecified: Secondary | ICD-10-CM

## 2022-03-07 LAB — HEPATIC FUNCTION PANEL
ALT: 45 IU/L — ABNORMAL HIGH (ref 0–32)
AST: 40 IU/L (ref 0–40)
Albumin: 4.6 g/dL (ref 3.8–4.8)
Alkaline Phosphatase: 65 IU/L (ref 44–121)
Bilirubin Total: 0.4 mg/dL (ref 0.0–1.2)
Bilirubin, Direct: 0.13 mg/dL (ref 0.00–0.40)
Total Protein: 7.2 g/dL (ref 6.0–8.5)

## 2022-04-17 ENCOUNTER — Ambulatory Visit: Payer: 59 | Admitting: Neurology

## 2022-04-17 ENCOUNTER — Encounter: Payer: Self-pay | Admitting: Neurology

## 2022-04-17 VITALS — BP 135/90 | HR 81 | Ht 64.0 in | Wt 186.5 lb

## 2022-04-17 DIAGNOSIS — E559 Vitamin D deficiency, unspecified: Secondary | ICD-10-CM

## 2022-04-17 DIAGNOSIS — R269 Unspecified abnormalities of gait and mobility: Secondary | ICD-10-CM | POA: Diagnosis not present

## 2022-04-17 DIAGNOSIS — Z79899 Other long term (current) drug therapy: Secondary | ICD-10-CM | POA: Diagnosis not present

## 2022-04-17 DIAGNOSIS — G35 Multiple sclerosis: Secondary | ICD-10-CM | POA: Insufficient documentation

## 2022-04-17 NOTE — Progress Notes (Signed)
[facility]  PATIENT: Martha Nelson DOB: 07/26/1976  REFERRING DOCTOR OR PCP: Linward Natal, PA-C SOURCE: Patient, notes from Dr. Doy Hutching, imaging and lab reports, MRI images personally reviewed.  _________________________________   HISTORICAL  CHIEF COMPLAINT:  Chief Complaint  Patient presents with   Follow-up    Rm 1, alone. Here to f/u for MS, off DMT. Pt reports doing well since last ov.     HISTORY OF PRESENT ILLNESS:  Martha Nelson is a 46 y.o. 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  Update 04/17/2022 She started Aubagio around the end of May and has done well.    No GI upset or other symptoms.  No hair loss.      Her MS has remained stable and no exacerbations..   Gait is now almost baseline and she has gun to jog again for exercise.      She holds the bannister since the symptoms in 07/2020 (she stumbled on stairs a few times then).  No numbness or tingling.    Bladder function is fine.    VIsion is fine.    She denies fatigue now.   She occasional has some word finding issues - stable..   She denies mood issues.   She sleeps well.  She has Hypertension (on chlorthalidone and metoprolol.    MS History: She had the onset of neurologic symptoms in 2021 and was diagnosed with MS in early 2022.  In November 2021 she had weakness in her left leg x 44month   She noted her leg gave out multiple times.   She improved a week later.    In December 2021, she had similar symptoms on the right side involving her right leg was giving out and she had tingling up her spine.    She also had vertigo.  These spells seemed to come on for 30-60 seconds and then she would feel better.   These occurred multiple times a day.    In January 2022, she received a course of steroids and had no further episodes   She had a lumbar spine MRI showing L5S disc protrusion and then an MRI of the brain 10/07/2020 showing a left cerebellum and left pons focus.     There was possble trace enhancement on contrasted  11/01/2020 MRi of the brain.       In retrospect she had visual blurring with reduced  in one eye in 2019 x 1 day.   She does not recall if colors were desaturated as events lasted just one day and she felt baseline the next day.   She was referred to Dr. FDoy Hutchingand had a lumbar puncture  12/01/2021 showing 10 oligoclonal bands.  She was started on Zeposia but lymphocytes were very low (0.2) so dose was changed to qod.  Lymphocytes were still low and she developed Covid.    [facility]     She has been off a DMT since November 14, 2021.    I saw her May 2023 and we discussed therapy options including the above.  I also discussed a drug study and Aubagio.  She opted to go on Aubagio and this was initiated at the end of May 2023.  Images personally reviewed:  MRI of the head 10/07/2020 shows a 7 mm ovoid focus in the left cerebellar hemisphere adjacent to the middle cerebellar peduncle.  There is a possible smaller focus in the pons.  Only a few punctate nonspecific  foci are noted in the hemisphere.  A repeat study with contrast was performed 11/01/2020 and the radiologist felt there may have been a trace of enhancement for that lesion.  MRI of the cervical spine 11/01/2020 redemonstrated the cerebellar lesion.  In the spinal cord, on the sagittal STIR and T2 weighted images, there appeared to be a possible focus towards the right adjacent to C5 though it is not clearly seen on axial images and is thus possible artifact there are degenerative changes noted at C4-C5 causing mild spinal stenosis but no nerve root compression and at C5-C6 causing mild spinal stenosis and left foraminal narrowing with potential for left C6 nerve root compression and at C6-C7 causing mild spinal stenosis and left foraminal narrowing with potential for left C7 nerve root compression.  Pertinent labs: CSF 12/01/2020 showed 10 oligoclonal bands in the CSF not present in the serum.   Myelin basic protein, NMO antibody were negative VDRL was negative  Blood work 12/15/2020 varicella-zoster antibody is positive.  Stratify JCV antibody was negative 12/01/2020:   REVIEW OF SYSTEMS: Constitutional: No fevers, chills, sweats, or change in appetite Eyes: No visual changes, double vision, eye pain Ear, nose and throat: No hearing loss, ear pain, nasal congestion, sore throat Cardiovascular: No chest pain, palpitations Respiratory:  No shortness of breath at rest or with exertion.   No wheezes GastrointestinaI: No nausea, vomiting, diarrhea, abdominal pain, fecal incontinence Genitourinary:  No dysuria, urinary retention or frequency.  No nocturia. Musculoskeletal:  No neck pain, back pain Integumentary: No rash, pruritus, skin lesions Neurological: as above Psychiatric: No depression at this time.  No anxiety Endocrine: No palpitations, diaphoresis, change in appetite, change in weigh or increased thirst Hematologic/Lymphatic:  No anemia, purpura, petechiae. Allergic/Immunologic: No itchy/runny eyes, nasal congestion, recent allergic reactions, rashes  ALLERGIES: No Known Allergies  HOME MEDICATIONS:  Current Outpatient Medications:    Cholecalciferol 125 MCG (5000 UT) TABS, Take 1 tablet by mouth 3 (three) times a week., Disp: , Rfl:    Teriflunomide 14 MG TABS, Take 1 tablet by mouth daily., Disp: , Rfl:   PAST MEDICAL HISTORY: Past Medical History:  Diagnosis Date   BMI 39.0-39.9,adult    Coronary artery spasm (HCC)    Degenerative disc disease, cervical    GERD (gastroesophageal reflux disease)    Gestational diabetes    Hypertension    Multiple sclerosis ([place])    Obesity    Palpitation    Sinus tachycardia     PAST SURGICAL HISTORY: Past Surgical History:  Procedure Laterality Date   CESAREAN SECTION     CHOLECYSTECTOMY     LEFT HEART CATH AND CORONARY ANGIOGRAPHY N/A 05/30/2017   Procedure: LEFT HEART CATH AND CORONARY ANGIOGRAPHY;  Surgeon: Nelva Bush, MD;  Location: [facility];  Service: Cardiovascular;  Laterality: N/A;   TUBAL LIGATION      FAMILY HISTORY: Family History  Problem Relation Age of Onset   Other Mother        MSA   High blood pressure Mother    Depression Mother    Arthritis Mother    Hypertension Father    Hyperlipidemia Father    Diabetes Maternal Grandmother     SOCIAL HISTORY:  Social History   Socioeconomic History   Marital status: Married    Spouse name: Jeneen Rinks   Number of children: 4   Years of education: Not on file   Highest education level: Master's degree (e.g., MA, MS, MEng, MEd, MSW, MBA)  Occupational History   Not on file  Tobacco Use   Smoking status: Never   Smokeless tobacco: Never  Substance and Sexual Activity   Alcohol use: No   Drug use: No   Sexual activity: Not on file  Other Topics Concern   Not on file  Social History Narrative   Lives at home w husband and children   Right handed   Caffeine: 2 coke zero a day   Social Determinants of Health   Financial Resource Strain: Not on file  Food Insecurity: Not on file  Transportation Needs: Not on file  Physical Activity: Not on file  Stress: Not on file  Social Connections: Not on file  Intimate Partner Violence: Not on file     PHYSICAL EXAM  Vitals:   04/17/22 1038  BP: (!) 135/90  Pulse: 81  Weight: 186 lb 8 oz (84.6 kg)  Height: '5\' 4"'$  (1.626 m)     Body mass index is 32.01 kg/m.      General: The patient is well-developed and well-nourished and in no acute distress  HEENT:  Head is [place]/AT.  Sclera are anicteric.  Skin: Extremities are without rash or  edema.  Neurologic Exam  Mental status: The patient is alert and oriented x 3 at the time of the examination. The patient has apparent normal recent and remote memory, with an apparently normal attention span and concentration ability.   Speech is normal.  Cranial nerves: Extraocular movements are full. Norma facial strength.   . No obvious hearing deficits are noted.  Motor:  Muscle bulk is normal.   Tone is normal. Strength is  5 / 5 in all 4 extremities.   Sensory: Sensory testing is intact to pinprick, soft touch and vibration sensation in all 4 extremities.  Coordination: Cerebellar testing reveals good finger-nose-finger and heel-to-shin bilaterally.  Gait and station: Station is normal.   Gait is normal. Tandem gait is mildly wide. Romberg is negative.   Reflexes: Deep tendon reflexes are symmetric and increased 3 in arms and increased in legs - spread at knees ad 2 bears at ankles.Marland Kitchen        DIAGNOSTIC DATA (LABS, IMAGING, TESTING) - I reviewed patient records, labs, notes, testing and imaging myself where available.  Lab Results  Component Value Date   WBC 6.0 01/01/2022   HGB 12.5 01/01/2022   HCT 36.7 01/01/2022   MCV 87 01/01/2022   PLT 233 01/01/2022      Component Value Date/Time   NA 138 05/09/2019 1205   K 3.8 05/09/2019 1205   CL 102 05/09/2019 1205   CO2 22 05/09/2019 1205   GLUCOSE 107 (H) 05/09/2019 1205   BUN 11 05/09/2019 1205   CREATININE 0.67 05/09/2019 1205   CALCIUM 9.7 05/09/2019 1205   PROT 7.2 03/06/2022 1031   ALBUMIN 4.6 03/06/2022 1031   AST 40 03/06/2022 1031   ALT 45 (H) 03/06/2022 1031   ALKPHOS 65 03/06/2022 1031   BILITOT 0.4 03/06/2022 1031   GFRNONAA >60 05/09/2019 1205   GFRAA >60 05/09/2019 1205   Lab Results  Component Value Date   CHOL 160 05/30/2017   HDL 50 05/30/2017   LDLCALC 100 (H) 05/30/2017   TRIG 51 05/30/2017   CHOLHDL 3.2 05/30/2017   Lab Results  Component Value Date   HGBA1C 4.9 05/30/2017   No results found for: "VITAMINB12" No results found for: "TSH"     ASSESSMENT AND PLAN  Multiple sclerosis ([place]) - Plan: Hepatic function  panel, CBC with Differential/Platelet, MR BRAIN W WO CONTRAST  High risk medication use - Plan: Hepatic function panel, CBC with Differential/Platelet  Gait abnormality  Vitamin D deficiency -  Plan: VITAMIN D 25 Hydroxy (Vit-D Deficiency, Fractures)    Continue Aubagio.  We will check some blood work today.  Additionally I will check an MRI of the brain to determine if there is any breakthrough activity.  If this is occurring we would need to step up her therapy to a more efficacious disease modifying therapy. Stay active and exercise as tolerated.  Continue vitamin D supplements.  I will check a level today. She will return to see me in 6 months or sooner if there are new or worsening neurologic symptoms.  40-minute office visit with the majority of the time spent face-to-face for history and physical, discussion/counseling and decision-making.  Additional time with record and imaging review and documentation.   Kampbell Holaway A. Felecia Shelling, MD, [facility] 10/19/5282, 13:24 AM Certified in Neurology, Clinical Neurophysiology, Sleep Medicine and Neuroimaging  [facility] [address], [place], [place] [address] [phone]

## 2022-04-18 LAB — HEPATIC FUNCTION PANEL
ALT: 6 IU/L (ref 0–32)
AST: 10 IU/L (ref 0–40)
Albumin: 4.5 g/dL (ref 3.9–4.9)
Alkaline Phosphatase: 70 IU/L (ref 44–121)
Bilirubin Total: 0.3 mg/dL (ref 0.0–1.2)
Bilirubin, Direct: 0.11 mg/dL (ref 0.00–0.40)
Total Protein: 7 g/dL (ref 6.0–8.5)

## 2022-04-18 LAB — VITAMIN D 25 HYDROXY (VIT D DEFICIENCY, FRACTURES): Vit D, 25-Hydroxy: 42.6 ng/mL (ref 30.0–100.0)

## 2022-04-18 LAB — CBC WITH DIFFERENTIAL/PLATELET
Basophils Absolute: 0 10*3/uL (ref 0.0–0.2)
Basos: 1 %
EOS (ABSOLUTE): 0.1 10*3/uL (ref 0.0–0.4)
Eos: 2 %
Hematocrit: 39 % (ref 34.0–46.6)
Hemoglobin: 12.8 g/dL (ref 11.1–15.9)
Immature Grans (Abs): 0 10*3/uL (ref 0.0–0.1)
Immature Granulocytes: 0 %
Lymphocytes Absolute: 1.1 10*3/uL (ref 0.7–3.1)
Lymphs: 21 %
MCH: 29.4 pg (ref 26.6–33.0)
MCHC: 32.8 g/dL (ref 31.5–35.7)
MCV: 90 fL (ref 79–97)
Monocytes Absolute: 0.4 10*3/uL (ref 0.1–0.9)
Monocytes: 7 %
Neutrophils Absolute: 3.9 10*3/uL (ref 1.4–7.0)
Neutrophils: 69 %
Platelets: 181 10*3/uL (ref 150–450)
RBC: 4.35 x10E6/uL (ref 3.77–5.28)
RDW: 13 % (ref 11.7–15.4)
WBC: 5.6 10*3/uL (ref 3.4–10.8)

## 2022-04-22 ENCOUNTER — Telehealth: Payer: Self-pay | Admitting: Neurology

## 2022-04-22 NOTE — Telephone Encounter (Signed)
[employer] Josem Kaufmann: [phone] exp. 04/22/22-06/06/22 sent to GI

## 2022-05-05 ENCOUNTER — Other Ambulatory Visit: Payer: 59

## 2022-05-29 ENCOUNTER — Other Ambulatory Visit (INDEPENDENT_AMBULATORY_CARE_PROVIDER_SITE_OTHER): Payer: 59

## 2022-05-29 DIAGNOSIS — Z0289 Encounter for other administrative examinations: Secondary | ICD-10-CM

## 2022-05-29 DIAGNOSIS — Z79899 Other long term (current) drug therapy: Secondary | ICD-10-CM

## 2022-05-29 DIAGNOSIS — G35 Multiple sclerosis: Secondary | ICD-10-CM

## 2022-05-30 LAB — HEPATIC FUNCTION PANEL
ALT: 5 IU/L (ref 0–32)
AST: 10 IU/L (ref 0–40)
Albumin: 4.7 g/dL (ref 3.9–4.9)
Alkaline Phosphatase: 65 IU/L (ref 44–121)
Bilirubin Total: 0.5 mg/dL (ref 0.0–1.2)
Bilirubin, Direct: 0.13 mg/dL (ref 0.00–0.40)
Total Protein: 6.7 g/dL (ref 6.0–8.5)

## 2022-06-20 ENCOUNTER — Encounter: Payer: Self-pay | Admitting: Neurology

## 2022-06-20 NOTE — Telephone Encounter (Signed)
Agree with above plans. Lets monitor for the next 24 hrs.

## 2022-07-24 ENCOUNTER — Other Ambulatory Visit: Payer: Self-pay | Admitting: Neurology

## 2022-07-24 ENCOUNTER — Other Ambulatory Visit (INDEPENDENT_AMBULATORY_CARE_PROVIDER_SITE_OTHER): Payer: Self-pay

## 2022-07-24 DIAGNOSIS — Z0289 Encounter for other administrative examinations: Secondary | ICD-10-CM

## 2022-07-25 LAB — HEPATIC FUNCTION PANEL
ALT: 5 IU/L (ref 0–32)
AST: 11 IU/L (ref 0–40)
Albumin: 4.5 g/dL (ref 3.9–4.9)
Alkaline Phosphatase: 64 IU/L (ref 44–121)
Bilirubin Total: 0.3 mg/dL (ref 0.0–1.2)
Bilirubin, Direct: <0.1 mg/dL (ref 0.00–0.40)
Total Protein: 6.8 g/dL (ref 6.0–8.5)

## 2022-09-11 ENCOUNTER — Ambulatory Visit
Admission: RE | Admit: 2022-09-11 | Discharge: 2022-09-11 | Disposition: A | Discharge status: Home / Self Care | Payer: 59 | Source: Ambulatory Visit | Attending: Neurology | Admitting: Neurology

## 2022-09-11 DIAGNOSIS — G35 Multiple sclerosis: Secondary | ICD-10-CM

## 2022-09-11 MED ORDER — GADOPICLENOL 0.5 MMOL/ML IV SOLN
8.0000 mL | Freq: Once | INTRAVENOUS | Status: AC | PRN
  Administered 2022-09-11: 8 mL via INTRAVENOUS

## 2022-10-02 ENCOUNTER — Telehealth: Payer: Self-pay | Admitting: Neurology

## 2022-10-02 ENCOUNTER — Encounter: Payer: Self-pay | Admitting: Neurology

## 2022-10-02 NOTE — Telephone Encounter (Signed)
LVM, sent mychart msg, sent letter advising pt of appt change- MD out 10/30/22.

## 2022-10-29 ENCOUNTER — Ambulatory Visit: Payer: 59 | Admitting: Neurology

## 2022-10-30 ENCOUNTER — Ambulatory Visit: Payer: 59 | Admitting: Neurology

## 2022-11-09 ENCOUNTER — Other Ambulatory Visit (HOSPITAL_COMMUNITY): Payer: Self-pay

## 2022-11-09 ENCOUNTER — Telehealth: Payer: 59 | Admitting: Physician Assistant

## 2022-11-09 DIAGNOSIS — A084 Viral intestinal infection, unspecified: Secondary | ICD-10-CM

## 2022-11-09 MED ORDER — ONDANSETRON 4 MG PO TBDP
4.0000 mg | ORAL_TABLET | Freq: Three times a day (TID) | ORAL | 0 refills | Status: DC | PRN
  Filled 2022-11-09: qty 20, 7d supply, fill #0

## 2022-11-09 NOTE — Progress Notes (Signed)

## 2022-11-13 ENCOUNTER — Ambulatory Visit: Payer: 59 | Admitting: Neurology

## 2022-11-13 ENCOUNTER — Encounter: Payer: Self-pay | Admitting: Neurology

## 2022-11-13 VITALS — BP 128/82 | HR 73 | Ht 64.0 in | Wt 172.8 lb

## 2022-11-13 DIAGNOSIS — G35 Multiple sclerosis: Secondary | ICD-10-CM

## 2022-11-13 DIAGNOSIS — R269 Unspecified abnormalities of gait and mobility: Secondary | ICD-10-CM | POA: Diagnosis not present

## 2022-11-13 DIAGNOSIS — E559 Vitamin D deficiency, unspecified: Secondary | ICD-10-CM | POA: Diagnosis not present

## 2022-11-13 DIAGNOSIS — Z79899 Other long term (current) drug therapy: Secondary | ICD-10-CM | POA: Diagnosis not present

## 2022-11-13 NOTE — Progress Notes (Signed)
[facility]  PATIENT: Martha Nelson DOB: 06-17-1976  REFERRING DOCTOR OR PCP: Linward Natal, PA-C SOURCE: Patient, notes from Dr. Doy Hutching, imaging and lab reports, MRI images personally reviewed.  _________________________________   HISTORICAL  CHIEF COMPLAINT:  Chief Complaint  Patient presents with   Follow-up    RM 10, alone. Last seen 04/17/22. MS DMT; teriflunomide (receives from [facility]). Doing well on med. Almost fainted this past weekend. Feels it is r/t stress. Mother is passing.     HISTORY OF PRESENT ILLNESS:  Martha Nelson is a 47 y.o. 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  Update 11/13/2022 She started Aubagio around May 2023 and has done well.    No GI upset or other symptoms.  No hair loss.    She is [facility].  Additionally, she feels her MS is stable and no exacerbations.Marland Kitchen   An MRI January 2024 showed no new lesions of the brain.  We discussed her unusual mass.  She has 1 classic appearing lesion in the cerebellum and multiple oligoclonal bands but has not had other lesions.  Gait is  baseline and she just did a 10K jog and is going to do a half marathon training.  .      She holds the bannister since the symptoms in 07/2020 --- now only downstairs.     No numbness or tingling.    Bladder function is fine.    VIsion is fine.    She denies fatigue now.   She occasional has some word finding issues - stable..   She is generally sleeping well.  She does have some stress with her mother's health issues.  She has Hypertension (on chlorthalidone and metoprolol.    MS History: She had the onset of neurologic symptoms in 2021 and was diagnosed with MS in early 2022.  In November 2021 she had weakness in her left leg x 45month   She noted her leg gave out multiple times.   She improved a week later.    In December 2021, she had similar symptoms on the right side involving her right leg was giving out and she had  tingling up her spine.    She also had vertigo.  These spells seemed to come on for 30-60 seconds and then she would feel better.   These occurred multiple times a day.    In January 2022, she received a course of steroids and had no further episodes   She had a lumbar spine MRI showing L5S disc protrusion and then an MRI of the brain 10/07/2020 showing a left cerebellum and left pons focus.     There was possble trace enhancement on contrasted 11/01/2020 MRi of the brain.       In retrospect she had visual blurring with reduced  in one eye in 2019 x 1 day.   She does not recall if colors were desaturated as events lasted just one day and she felt baseline the next day.   She was referred to Dr. FDoy Hutchingand had a lumbar puncture  12/01/2021 showing 10 oligoclonal bands.  She was started on Zeposia but lymphocytes were very low (0.2) so dose was changed to qod.  Lymphocytes were still low and she developed Covid.    [facility]     She has been off a DMT since November 14, 2021.    I saw her May 2023 and  we discussed therapy options including the above.  I also discussed a drug study and Aubagio.  She opted to go on Aubagio and this was initiated at the end of May 2023.  Images MRI 09/11/2022 MRI brain showed Single T2/FLAIR hyperintense focus in the left cerebellar hemisphere. It does not enhance and is unchanged compared to 10/07/2020 MRI although nonspecific, it is compatible with a diagnosis of a chronic demyelinating plaque.   MRI of the head 10/07/2020 shows a 7 mm ovoid focus in the left cerebellar hemisphere adjacent to the middle cerebellar peduncle.  There is a possible smaller focus in the pons.  Only a few punctate nonspecific foci are noted in the hemisphere.  A repeat study with contrast was performed 11/01/2020 and the radiologist felt there may have been a trace of enhancement for that lesion.  MRI of the cervical spine 11/01/2020 redemonstrated  the cerebellar lesion.  In the spinal cord, on the sagittal STIR and T2 weighted images, there appeared to be a possible focus towards the right adjacent to C5 though it is not clearly seen on axial images and is thus possible artifact there are degenerative changes noted at C4-C5 causing mild spinal stenosis but no nerve root compression and at C5-C6 causing mild spinal stenosis and left foraminal narrowing with potential for left C6 nerve root compression and at C6-C7 causing mild spinal stenosis and left foraminal narrowing with potential for left C7 nerve root compression.  Pertinent labs: CSF 12/01/2020 showed 10 oligoclonal bands in the CSF not present in the serum.  Myelin basic protein, NMO antibody were negative VDRL was negative  Blood work 12/15/2020 varicella-zoster antibody is positive.  Stratify JCV antibody was negative 12/01/2020:   REVIEW OF SYSTEMS: Constitutional: No fevers, chills, sweats, or change in appetite Eyes: No visual changes, double vision, eye pain Ear, nose and throat: No hearing loss, ear pain, nasal congestion, sore throat Cardiovascular: No chest pain, palpitations Respiratory:  No shortness of breath at rest or with exertion.   No wheezes GastrointestinaI: No nausea, vomiting, diarrhea, abdominal pain, fecal incontinence Genitourinary:  No dysuria, urinary retention or frequency.  No nocturia. Musculoskeletal:  No neck pain, back pain Integumentary: No rash, pruritus, skin lesions Neurological: as above Psychiatric: No depression at this time.  No anxiety Endocrine: No palpitations, diaphoresis, change in appetite, change in weigh or increased thirst Hematologic/Lymphatic:  No anemia, purpura, petechiae. Allergic/Immunologic: No itchy/runny eyes, nasal congestion, recent allergic reactions, rashes  ALLERGIES: No Known Allergies  HOME MEDICATIONS:  Current Outpatient Medications:    Cholecalciferol 125 MCG (5000 UT) TABS, Take 1 tablet by mouth 3 (three)  times a week., Disp: , Rfl:    ondansetron (ZOFRAN-ODT) 4 MG disintegrating tablet, Take 1 tablet (4 mg total) by mouth every 8 (eight) hours as needed., Disp: 20 tablet, Rfl: 0   Teriflunomide 14 MG TABS, Take 1 tablet by mouth daily., Disp: , Rfl:   PAST MEDICAL HISTORY: Past Medical History:  Diagnosis Date   BMI 39.0-39.9,adult    Coronary artery spasm (HCC)    Degenerative disc disease, cervical    GERD (gastroesophageal reflux disease)    Gestational diabetes    Hypertension    Multiple sclerosis ([place])    Obesity    Palpitation    Sinus tachycardia     PAST SURGICAL HISTORY: Past Surgical History:  Procedure Laterality Date   CESAREAN SECTION     CHOLECYSTECTOMY     LEFT HEART CATH AND CORONARY ANGIOGRAPHY N/A 05/30/2017  Procedure: LEFT HEART CATH AND CORONARY ANGIOGRAPHY;  Surgeon: Nelva Bush, MD;  Location: [facility];  Service: Cardiovascular;  Laterality: N/A;   TUBAL LIGATION      FAMILY HISTORY: Family History  Problem Relation Age of Onset   Other Mother        MSA   High blood pressure Mother    Depression Mother    Arthritis Mother    Hypertension Father    Hyperlipidemia Father    Diabetes Maternal Grandmother     SOCIAL HISTORY:  Social History   Socioeconomic History   Marital status: Married    Spouse name: Jeneen Rinks   Number of children: 4   Years of education: Not on file   Highest education level: Master's degree (e.g., MA, MS, MEng, MEd, MSW, MBA)  Occupational History   Not on file  Tobacco Use   Smoking status: Never   Smokeless tobacco: Never  Substance and Sexual Activity   Alcohol use: Yes    Comment: Only Christmas eve   Drug use: No   Sexual activity: Not on file  Other Topics Concern   Not on file  Social History Narrative   Lives at home w husband and children   Right handed   Caffeine: 2 coke zero a day   No coffee/tea   Social Determinants of Health   Financial Resource Strain: Not on file  Food  Insecurity: Not on file  Transportation Needs: Not on file  Physical Activity: Not on file  Stress: Not on file  Social Connections: Not on file  Intimate Partner Violence: Not on file     PHYSICAL EXAM  Vitals:   11/13/22 0807 11/13/22 0815  BP: (!) 152/83 128/82  Pulse: 78 73  Weight: 172 lb 12.8 oz (78.4 kg)   Height: '5\' 4"'$  (1.626 m)      Body mass index is 29.66 kg/m.      General: The patient is well-developed and well-nourished and in no acute distress  HEENT:  Head is /AT.  Sclera are anicteric.  Skin: Extremities are without rash or  edema.  Neurologic Exam  Mental status: The patient is alert and oriented x 3 at the time of the examination. The patient has apparent normal recent and remote memory, with an apparently normal attention span and concentration ability.   Speech is normal.  Cranial nerves: Extraocular movements are full. Norma facial strength.  . No obvious hearing deficits are noted.  Motor:  Muscle bulk is normal.   Tone is normal. Strength is  5 / 5 in all 4 extremities.   Sensory: Sensory testing is intact to pinprick, soft touch and vibration sensation in all 4 extremities.  Coordination: Cerebellar testing reveals good finger-nose-finger and heel-to-shin bilaterally.  Gait and station: Station is normal.   Gait is normal. Tandem gait is mildly wide. Romberg is negative.   Reflexes: Deep tendon reflexes are symmetric and increased 3 in arms and increased in legs - spread at knees ad 2 bears at ankles.Marland Kitchen        DIAGNOSTIC DATA (LABS, IMAGING, TESTING) - I reviewed patient records, labs, notes, testing and imaging myself where available.  Lab Results  Component Value Date   WBC 5.6 04/17/2022   HGB 12.8 04/17/2022   HCT 39.0 04/17/2022   MCV 90 04/17/2022   PLT 181 04/17/2022      Component Value Date/Time   NA 138 05/09/2019 1205   K 3.8 05/09/2019 1205   CL  102 05/09/2019 1205   CO2 22 05/09/2019 1205   GLUCOSE 107 (H)  05/09/2019 1205   BUN 11 05/09/2019 1205   CREATININE 0.67 05/09/2019 1205   CALCIUM 9.7 05/09/2019 1205   PROT 6.8 07/24/2022 1020   ALBUMIN 4.5 07/24/2022 1020   AST 11 07/24/2022 1020   ALT 5 07/24/2022 1020   ALKPHOS 64 07/24/2022 1020   BILITOT 0.3 07/24/2022 1020   GFRNONAA >60 05/09/2019 1205   GFRAA >60 05/09/2019 1205   Lab Results  Component Value Date   CHOL 160 05/30/2017   HDL 50 05/30/2017   LDLCALC 100 (H) 05/30/2017   TRIG 51 05/30/2017   CHOLHDL 3.2 05/30/2017   Lab Results  Component Value Date   HGBA1C 4.9 05/30/2017   No results found for: "VITAMINB12" No results found for: "TSH"     ASSESSMENT AND PLAN  Multiple sclerosis ([place])  High risk medication use  Gait abnormality  Vitamin D deficiency    Continue teriflunomide.  We will check some blood work today.    Stay active and exercise as tolerated.  Continue vitamin D supplements.    She will return to see me in 6 months or sooner if there are new or worsening neurologic symptoms.    Angila Wombles A. Felecia Shelling, MD, [facility] [id number], [id number] AM Certified in Neurology, Clinical Neurophysiology, Sleep Medicine and Neuroimaging  [facility] [address] [place], [place] [address] [phone]

## 2022-11-14 LAB — HEPATIC FUNCTION PANEL
ALT: 20 IU/L (ref 0–32)
AST: 22 IU/L (ref 0–40)
Albumin: 4.5 g/dL (ref 3.9–4.9)
Alkaline Phosphatase: 61 IU/L (ref 44–121)
Bilirubin Total: 0.4 mg/dL (ref 0.0–1.2)
Bilirubin, Direct: 0.14 mg/dL (ref 0.00–0.40)
Total Protein: 6.2 g/dL (ref 6.0–8.5)

## 2022-11-14 LAB — CBC WITH DIFFERENTIAL/PLATELET
Basophils Absolute: 0 10*3/uL (ref 0.0–0.2)
Basos: 1 %
EOS (ABSOLUTE): 0.2 10*3/uL (ref 0.0–0.4)
Eos: 4 %
Hematocrit: 38.8 % (ref 34.0–46.6)
Hemoglobin: 13 g/dL (ref 11.1–15.9)
Immature Grans (Abs): 0 10*3/uL (ref 0.0–0.1)
Immature Granulocytes: 0 %
Lymphocytes Absolute: 1 10*3/uL (ref 0.7–3.1)
Lymphs: 26 %
MCH: 29.7 pg (ref 26.6–33.0)
MCHC: 33.5 g/dL (ref 31.5–35.7)
MCV: 89 fL (ref 79–97)
Monocytes Absolute: 0.4 10*3/uL (ref 0.1–0.9)
Monocytes: 9 %
Neutrophils Absolute: 2.3 10*3/uL (ref 1.4–7.0)
Neutrophils: 60 %
Platelets: 167 10*3/uL (ref 150–450)
RBC: 4.37 x10E6/uL (ref 3.77–5.28)
RDW: 12.9 % (ref 11.7–15.4)
WBC: 3.9 10*3/uL (ref 3.4–10.8)

## 2023-05-21 ENCOUNTER — Encounter: Payer: Self-pay | Admitting: Neurology

## 2023-05-21 ENCOUNTER — Ambulatory Visit: Payer: 59 | Admitting: Neurology

## 2023-05-21 VITALS — BP 130/76 | HR 65 | Ht 64.0 in | Wt 179.4 lb

## 2023-05-21 DIAGNOSIS — E559 Vitamin D deficiency, unspecified: Secondary | ICD-10-CM

## 2023-05-21 DIAGNOSIS — R269 Unspecified abnormalities of gait and mobility: Secondary | ICD-10-CM | POA: Diagnosis not present

## 2023-05-21 DIAGNOSIS — G35 Multiple sclerosis: Secondary | ICD-10-CM | POA: Diagnosis not present

## 2023-05-21 DIAGNOSIS — Z79899 Other long term (current) drug therapy: Secondary | ICD-10-CM | POA: Diagnosis not present

## 2023-05-21 MED ORDER — TERIFLUNOMIDE 14 MG PO TABS: 1.0000 | ORAL_TABLET | Freq: Every day | ORAL | 4 refills | Status: DC

## 2023-05-21 NOTE — Progress Notes (Signed)
[facility]  PATIENT: Martha Nelson DOB: 1976/08/20  REFERRING DOCTOR OR PCP: Farley Ly, PA-C SOURCE: Patient, notes from Dr. Alton Revere, imaging and lab reports, MRI images personally reviewed.  _________________________________   HISTORICAL  CHIEF COMPLAINT:  Chief Complaint  Patient presents with   Follow-up    Pt in room 10. Here for MS follow up. Pt reports doing well, no concerns or issues.     HISTORY OF PRESENT ILLNESS:  [name] Feeling is a 47 y.o. 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  Update 05/21/23 She started Aubagio around May 2023 and has done well.    No GI upset or other symptoms.  No hair loss.    She is [facility].  Additionally, she feels her MS is stable and no exacerbations.Marland Kitchen   An MRI January 2024 showed no new lesions of the brain.  We discussed her unusual MS.  She has 1 classic appearing lesion in the cerebellum and multiple oligoclonal bands but has not had other lesions.  Gait is  baseline.  She is training for a half marathon.   She no longer holds the bannister since the symptoms in 07/2020 --- now only downstairs.     No numbness or tingling.    Bladder function is fine.    VIsion is fine.    She denies fatigue now.   She sometimes notes mild word finding issues.   She is generally sleeping well.  She does have some stress with her mother's health issues.    She has Hypertension (on chlorthalidone and metoprolol.    MS History: She had the onset of neurologic symptoms in 2021 and was diagnosed with MS in early 2022.  In November 2021 she had weakness in her left leg x 56month.   She noted her leg gave out multiple times.   She improved a week later.    In December 2021, she had similar symptoms on the right side involving her right leg was giving out and she had tingling up her spine.    She also had vertigo.  These spells seemed to come on for 30-60 seconds and then she would feel better.   These occurred multiple times  a day.    In January 2022, she received a course of steroids and had no further episodes   She had a lumbar spine MRI showing L5S disc protrusion and then an MRI of the brain 10/07/2020 showing a left cerebellum and left pons focus.     There was possble trace enhancement on contrasted 11/01/2020 MRi of the brain.       In retrospect she had visual blurring with reduced  in one eye in 2019 x 1 day.   She does not recall if colors were desaturated as events lasted just one day and she felt baseline the next day.   She was referred to Dr. Alton Revere and had a lumbar puncture  12/01/2021 showing 10 oligoclonal bands.  She was started on Zeposia but lymphocytes were very low (0.2) so dose was changed to qod.  Lymphocytes were still low and she developed Covid.    She then was advised to switch [facility] were discussed).     She has been off a DMT since November 14, 2021.    I saw her May 2023 and we discussed therapy options including the above.  I also discussed a drug study and Aubagio.  She opted to go on Aubagio and this was initiated  at the end of May 2023.  Images MRI 09/11/2022 MRI brain showed Single T2/FLAIR hyperintense focus in the left cerebellar hemisphere. It does not enhance and is unchanged compared to 10/07/2020 MRI although nonspecific, it is compatible with a diagnosis of a chronic demyelinating plaque.   MRI of the head 10/07/2020 shows a 7 mm ovoid focus in the left cerebellar hemisphere adjacent to the middle cerebellar peduncle.  There is a possible smaller focus in the pons.  Only a few punctate nonspecific foci are noted in the hemisphere.  A repeat study with contrast was performed 11/01/2020 and the radiologist felt there may have been a trace of enhancement for that lesion.  MRI of the cervical spine 11/01/2020 redemonstrated the cerebellar lesion.  In the spinal cord, on the sagittal STIR and T2 weighted images, there appeared to be a possible focus towards the right adjacent to C5  though it is not clearly seen on axial images and is thus possible artifact there are degenerative changes noted at C4-C5 causing mild spinal stenosis but no nerve root compression and at C5-C6 causing mild spinal stenosis and left foraminal narrowing with potential for left C6 nerve root compression and at C6-C7 causing mild spinal stenosis and left foraminal narrowing with potential for left C7 nerve root compression.  Pertinent labs: CSF 12/01/2020 showed 10 oligoclonal bands in the CSF not present in the serum.  Myelin basic protein, NMO antibody were negative VDRL was negative  Blood work 12/15/2020 varicella-zoster antibody is positive.  Stratify JCV antibody was negative 12/01/2020:   REVIEW OF SYSTEMS: Constitutional: No fevers, chills, sweats, or change in appetite Eyes: No visual changes, double vision, eye pain Ear, nose and throat: No hearing loss, ear pain, nasal congestion, sore throat Cardiovascular: No chest pain, palpitations Respiratory:  No shortness of breath at rest or with exertion.   No wheezes GastrointestinaI: No nausea, vomiting, diarrhea, abdominal pain, fecal incontinence Genitourinary:  No dysuria, urinary retention or frequency.  No nocturia. Musculoskeletal:  No neck pain, back pain Integumentary: No rash, pruritus, skin lesions Neurological: as above Psychiatric: No depression at this time.  No anxiety Endocrine: No palpitations, diaphoresis, change in appetite, change in weigh or increased thirst Hematologic/Lymphatic:  No anemia, purpura, petechiae. Allergic/Immunologic: No itchy/runny eyes, nasal congestion, recent allergic reactions, rashes  ALLERGIES: No Known Allergies  HOME MEDICATIONS:  Current Outpatient Medications:    Cholecalciferol 125 MCG (5000 UT) TABS, Take 1 tablet by mouth 3 (three) times a week., Disp: , Rfl:    ondansetron (ZOFRAN-ODT) 4 MG disintegrating tablet, Take 1 tablet (4 mg total) by mouth every 8 (eight) hours as needed., Disp:  20 tablet, Rfl: 0   Teriflunomide 14 MG TABS, Take 1 tablet (14 mg total) by mouth daily., Disp: 90 tablet, Rfl: 4  PAST MEDICAL HISTORY: Past Medical History:  Diagnosis Date   BMI 39.0-39.9,adult    Coronary artery spasm (HCC)    Degenerative disc disease, cervical    GERD (gastroesophageal reflux disease)    Gestational diabetes    Hypertension    Multiple sclerosis (HCC)    Obesity    Palpitation    Sinus tachycardia     PAST SURGICAL HISTORY: Past Surgical History:  Procedure Laterality Date   CESAREAN SECTION     CHOLECYSTECTOMY     LEFT HEART CATH AND CORONARY ANGIOGRAPHY N/A 05/30/2017   Procedure: LEFT HEART CATH AND CORONARY ANGIOGRAPHY;  Surgeon: Yvonne Kendall, MD;  Location: [facility];  Service: Cardiovascular;  Laterality:  N/A;   TUBAL LIGATION      FAMILY HISTORY: Family History  Problem Relation Age of Onset   Other Mother        MSA   High blood pressure Mother    Depression Mother    Arthritis Mother    Hypertension Father    Hyperlipidemia Father    Diabetes Maternal Grandmother     SOCIAL HISTORY:  Social History   Socioeconomic History   Marital status: Married    Spouse name: Fayrene Fearing   Number of children: 4   Years of education: Not on file   Highest education level: Master's degree (e.g., MA, MS, MEng, MEd, MSW, MBA)  Occupational History   Not on file  Tobacco Use   Smoking status: Never   Smokeless tobacco: Never  Substance and Sexual Activity   Alcohol use: Yes    Comment: Only Christmas eve   Drug use: No   Sexual activity: Not on file  Other Topics Concern   Not on file  Social History Narrative   Lives at home w husband and children   Right handed   Caffeine: 2 coke zero a day   No coffee/tea   Social Determinants of Health   Financial Resource Strain: Not on file  Food Insecurity: Not on file  Transportation Needs: Not on file  Physical Activity: Not on file  Stress: Not on file  Social Connections: Not  on file  Intimate Partner Violence: Not on file     PHYSICAL EXAM  Vitals:   05/21/23 1543 05/21/23 1544  BP: (!) 144/84 130/76  Pulse: 67 65  Weight: 179 lb 6.4 oz (81.4 kg)   Height: 5\' 4"  (1.626 m)      Body mass index is 30.79 kg/m.      General: The patient is well-developed and well-nourished and in no acute distress  HEENT:  Head is [place]/AT.  Sclera are anicteric.  Skin: Extremities are without rash or  edema.  Neurologic Exam  Mental status: The patient is alert and oriented x 3 at the time of the examination. The patient has apparent normal recent and remote memory, with an apparently normal attention span and concentration ability.   Speech is normal.  Cranial nerves: Extraocular movements are full. Norma facial strength.  . No obvious hearing deficits are noted.  Motor:  Muscle bulk is normal.   Tone is normal. Strength is  5 / 5 in all 4 extremities.   Sensory: Sensory testing is intact to pinprick, soft touch and vibration sensation in all 4 extremities.  Coordination: Cerebellar testing reveals good finger-nose-finger and heel-to-shin bilaterally.  Gait and station: Station is normal.   Gait is normal. Tandem gait is mildly wide. Romberg is negative.   Reflexes: Deep tendon reflexes are symmetric and increased 3 in arms and increased in legs - spread at knees n 2 bears at ankles.Marland Kitchen        DIAGNOSTIC DATA (LABS, IMAGING, TESTING) - I reviewed patient records, labs, notes, testing and imaging myself where available.  Lab Results  Component Value Date   WBC 3.9 11/13/2022   HGB 13.0 11/13/2022   HCT 38.8 11/13/2022   MCV 89 11/13/2022   PLT 167 11/13/2022      Component Value Date/Time   NA 138 05/09/2019 1205   K 3.8 05/09/2019 1205   CL 102 05/09/2019 1205   CO2 22 05/09/2019 1205   GLUCOSE 107 (H) 05/09/2019 1205   BUN 11 05/09/2019 1205  CREATININE 0.67 05/09/2019 1205   CALCIUM 9.7 05/09/2019 1205   PROT 6.2 11/13/2022 0845   ALBUMIN  4.5 11/13/2022 0845   AST 22 11/13/2022 0845   ALT 20 11/13/2022 0845   ALKPHOS 61 11/13/2022 0845   BILITOT 0.4 11/13/2022 0845   GFRNONAA >60 05/09/2019 1205   GFRAA >60 05/09/2019 1205   Lab Results  Component Value Date   CHOL 160 05/30/2017   HDL 50 05/30/2017   LDLCALC 100 (H) 05/30/2017   TRIG 51 05/30/2017   CHOLHDL 3.2 05/30/2017   Lab Results  Component Value Date   HGBA1C 4.9 05/30/2017   No results found for: "VITAMINB12" No results found for: "TSH"     ASSESSMENT AND PLAN  Multiple sclerosis (HCC)  High risk medication use  Gait abnormality  Vitamin D deficiency    She has been stable and she will continue teriflunomide.   We did discuss that her MS is unusual with just 1 focus in the cerebellum (that would be consistent with demyelination) however, she had multiple oligoclonal bands consistent with intrathecal production.  Labs have been fine.   MRI 09/2022 was stable.    Stay active and exercise as tolerated.  Continue vitamin D supplements.    She will return to see me in 6 months or sooner if there are new or worsening neurologic symptoms.    Arren Laminack A. Epimenio Foot, MD, [facility] 05/21/2023, 4:12 PM Certified in Neurology, Clinical Neurophysiology, Sleep Medicine and Neuroimaging  [facility] [address], [address] [place], [place] [address] [phone]

## 2023-05-29 ENCOUNTER — Encounter: Payer: Self-pay | Admitting: Neurology

## 2023-06-06 LAB — COLOGUARD: COLOGUARD: NEGATIVE

## 2023-12-03 ENCOUNTER — Ambulatory Visit: Payer: 59 | Admitting: Neurology

## 2024-08-13 ENCOUNTER — Encounter: Payer: Self-pay | Admitting: Podiatry

## 2024-08-13 ENCOUNTER — Other Ambulatory Visit (HOSPITAL_BASED_OUTPATIENT_CLINIC_OR_DEPARTMENT_OTHER): Payer: Self-pay

## 2024-08-13 ENCOUNTER — Ambulatory Visit (INDEPENDENT_AMBULATORY_CARE_PROVIDER_SITE_OTHER)

## 2024-08-13 ENCOUNTER — Ambulatory Visit: Admitting: Podiatry

## 2024-08-13 DIAGNOSIS — M79671 Pain in right foot: Secondary | ICD-10-CM | POA: Diagnosis not present

## 2024-08-13 DIAGNOSIS — M7751 Other enthesopathy of right foot: Secondary | ICD-10-CM | POA: Diagnosis not present

## 2024-08-13 DIAGNOSIS — M778 Other enthesopathies, not elsewhere classified: Secondary | ICD-10-CM

## 2024-08-13 DIAGNOSIS — M25571 Pain in right ankle and joints of right foot: Secondary | ICD-10-CM

## 2024-08-13 MED ORDER — METHYLPREDNISOLONE 4 MG PO TBPK
ORAL_TABLET | ORAL | 0 refills | Status: DC
  Filled 2024-08-13: qty 21, 6d supply, fill #0

## 2024-08-13 NOTE — Progress Notes (Signed)
 Patient presents with pain indicating over the foot over the sinus tarsi.  I get the pain along here and along the lateral ankle.  She does a lot of trail running and also road running for training.  Does not recall an injury injury to the foot.  She has several races coming up.  Has not noticed any redness or ecchymosis.   Physical exam:  General appearance: Pleasant, and in no acute distress. AOx3.  Vascular: Pedal pulses: DP 2/4 bilaterally, PT 2 to/4 bilaterally.  Minimal edema lower legs bilaterally. Capillary fill time immediate bilaterally.  Neurological: Light touch intact feet bilaterally.  Normal Achilles reflex bilaterally.  No clonus or spasticity noted.   Dermatologic:   Skin normal temperature bilaterally.  Skin normal color, tone, and texture bilaterally.   Musculoskeletal: Tenderness with range of motion palpation of the subtalar joint and sinus tarsi right.  Some tenderness along the peroneal tendons at the distal fibula right.  Normal muscle strength lower extremity bilaterally  Radiographs: Radiographs 3 views right foot: No has any fractures or dislocations.  Normal joint space of the subtalar joint and midtarsal joints.  Mild tailor's bunion deformity with increased fourth fifth intermetatarsal angle.  No evidence of any bone tumors.  Normal bone density.  Diagnosis: 1.  Pain right foot. 2.  Arthralgia subtalar joint right  Plan: -New patient office visit for evaluation and management level 3. - Discussed with the pain in the joint and probably getting some secondary peroneal tendinitis from compensating.  Discussed etiology and treatment.  He does a lot of trail running discussed with her proper running shoes for [place].  Told her to watch the mileage on her shoes.  RICE.  She can use a topical Voltaren gel.  She may want to consider custom orthotics bilaterally. -Rx Medrol Dosepak take as directed.   -If pain continues we could also consider an injection  of corticosteroid.  Return as needed

## 2024-08-15 ENCOUNTER — Other Ambulatory Visit: Payer: Self-pay | Admitting: Medical Genetics

## 2024-10-06 ENCOUNTER — Ambulatory Visit: Admitting: Family Medicine

## 2024-10-06 ENCOUNTER — Encounter (HOSPITAL_BASED_OUTPATIENT_CLINIC_OR_DEPARTMENT_OTHER): Payer: Self-pay

## 2024-10-06 ENCOUNTER — Other Ambulatory Visit (HOSPITAL_BASED_OUTPATIENT_CLINIC_OR_DEPARTMENT_OTHER): Payer: Self-pay

## 2024-10-06 VITALS — BP 128/60 | HR 75 | Temp 98.2°F | Ht 64.0 in | Wt 198.2 lb

## 2024-10-06 DIAGNOSIS — Z6834 Body mass index (BMI) 34.0-34.9, adult: Secondary | ICD-10-CM | POA: Diagnosis not present

## 2024-10-06 DIAGNOSIS — E66811 Obesity, class 1: Secondary | ICD-10-CM

## 2024-10-06 LAB — HEMOGLOBIN A1C: Hgb A1c MFr Bld: 5 % (ref 4.6–6.5)

## 2024-10-06 LAB — COMPREHENSIVE METABOLIC PANEL WITH GFR
ALT: 7 U/L (ref 3–35)
AST: 14 U/L (ref 5–37)
Albumin: 4.8 g/dL (ref 3.5–5.2)
Alkaline Phosphatase: 52 U/L (ref 39–117)
BUN: 10 mg/dL (ref 6–23)
CO2: 29 meq/L (ref 19–32)
Calcium: 9.8 mg/dL (ref 8.4–10.5)
Chloride: 101 meq/L (ref 96–112)
Creatinine, Ser: 0.72 mg/dL (ref 0.40–1.20)
GFR: 100.05 mL/min
Glucose, Bld: 92 mg/dL (ref 70–99)
Potassium: 4.3 meq/L (ref 3.5–5.1)
Sodium: 138 meq/L (ref 135–145)
Total Bilirubin: 0.6 mg/dL (ref 0.2–1.2)
Total Protein: 7.7 g/dL (ref 6.0–8.3)

## 2024-10-06 LAB — CBC WITH DIFFERENTIAL/PLATELET
Basophils Absolute: 0 10*3/uL (ref 0.0–0.1)
Basophils Relative: 0.4 % (ref 0.0–3.0)
Eosinophils Absolute: 0.1 10*3/uL (ref 0.0–0.7)
Eosinophils Relative: 1.5 % (ref 0.0–5.0)
HCT: 41.5 % (ref 36.0–46.0)
Hemoglobin: 14.1 g/dL (ref 12.0–15.0)
Lymphocytes Relative: 22.5 % (ref 12.0–46.0)
Lymphs Abs: 1.8 10*3/uL (ref 0.7–4.0)
MCHC: 34 g/dL (ref 30.0–36.0)
MCV: 86.2 fl (ref 78.0–100.0)
Monocytes Absolute: 0.4 10*3/uL (ref 0.1–1.0)
Monocytes Relative: 5.4 % (ref 3.0–12.0)
Neutro Abs: 5.5 10*3/uL (ref 1.4–7.7)
Neutrophils Relative %: 70.2 % (ref 43.0–77.0)
Platelets: 237 10*3/uL (ref 150.0–400.0)
RBC: 4.81 Mil/uL (ref 3.87–5.11)
RDW: 13.6 % (ref 11.5–15.5)
WBC: 7.9 10*3/uL (ref 4.0–10.5)

## 2024-10-06 LAB — LIPID PANEL
Cholesterol: 157 mg/dL (ref 28–200)
HDL: 47.4 mg/dL
LDL Cholesterol: 91 mg/dL (ref 10–99)
NonHDL: 109.95
Total CHOL/HDL Ratio: 3
Triglycerides: 95 mg/dL (ref 10.0–149.0)
VLDL: 19 mg/dL (ref 0.0–40.0)

## 2024-10-06 MED ORDER — WEGOVY 1.5 MG PO TABS
1.5000 mg | ORAL_TABLET | Freq: Every day | ORAL | 0 refills | Status: AC
  Filled 2024-10-06: qty 30, 30d supply, fill #0

## 2024-10-06 NOTE — Patient Instructions (Signed)
 Miralax or duloclax Fiber gummies Imodium for diarrhea  Multivitamin to prevent deficiencies Stay hydrated.   Message me if there is nausea

## 2024-10-06 NOTE — Progress Notes (Signed)
 "  New Patient Office Visit  Subjective    Patient ID: Martha Nelson, female    DOB: 25-Oct-1975  Age: 49 y.o. MRN: [id number]  CC:  Chief Complaint  Patient presents with   Establish Care    HPI Martha Nelson presents to establish care   Discussed the use of AI scribe software for clinical note transcription with the patient, who gave verbal consent to proceed.  History of Present Illness   Martha Nelson is a 49 year old female who presents for a new patient visit focused on weight loss management.  She has lost 60 pounds through diet and daily exercise, including running and strength training, from a peak of 260 pounds to 190 pounds, with a goal of 150 pounds. She follows a 1600-1800 calorie diet with intermittent fasting and is interested in Wegovy  for ongoing weight management.  On weekdays she typically skips breakfast, eats low-calorie, high-protein lunches and lean protein with vegetables for dinner. On weekends she increases carbohydrates to support long runs and does not track race-day calories.  She has joint pain related to running that she manages with creatine and is training for another marathon.  She has multiple sclerosis, currently asymptomatic and off medication, and plans neurology follow-up.  She reports a prior non-ST elevation myocardial infarction diagnosis that she questions. Cardiac catheterization was normal, and she has no current cardiac symptoms. Her blood pressure and acid reflux resolved with weight loss.      Outpatient Encounter Medications as of 10/06/2024  Medication Sig   CREATINE PO Take 3 each by mouth daily.   semaglutide -weight management (WEGOVY ) 1.5 MG tablet Take 1 tablet (1.5 mg total) by mouth daily. Daily in AM on an empty stomach with 4 oz of water. Do not eat or drink for 30 minutes after dose.   [DISCONTINUED] Cholecalciferol 125 MCG (5000 UT) TABS Take 1 tablet by mouth 3 (three) times a week. (Patient not taking: Reported on 08/13/2024)    [DISCONTINUED] methylPREDNISolone  (MEDROL  DOSEPAK) 4 MG TBPK tablet Take as directed   [DISCONTINUED] Teriflunomide  14 MG TABS Take 1 tablet (14 mg total) by mouth daily. (Patient not taking: Reported on 08/13/2024)   No facility-administered encounter medications on file as of 10/06/2024.    Past Medical History:  Diagnosis Date   BMI 39.0-39.9,adult    Coronary artery spasm    Degenerative disc disease, cervical    GERD (gastroesophageal reflux disease)    Hypertension    Multiple sclerosis    NSTEMI (non-ST elevated myocardial infarction) (HCC) 05/30/2017   Obesity    Palpitation    Sinus tachycardia     Past Surgical History:  Procedure Laterality Date   CESAREAN SECTION     CHOLECYSTECTOMY     LEFT HEART CATH AND CORONARY ANGIOGRAPHY N/A 05/30/2017   Procedure: LEFT HEART CATH AND CORONARY ANGIOGRAPHY;  Surgeon: Mady Bruckner, MD;  Location: [facility];  Service: Cardiovascular;  Laterality: N/A;   TUBAL LIGATION      Family History  Problem Relation Age of Onset   Other Mother        MSA   High blood pressure Mother    Depression Mother    Arthritis Mother    Hypertension Father    Hyperlipidemia Father    Diabetes Maternal Grandmother     Social History   Socioeconomic History   Marital status: Married    Spouse name: Lynwood   Number of children: 4   Years of education: Not on  file   Highest education level: Master's degree (e.g., MA, MS, MEng, MEd, MSW, MBA)  Occupational History   Not on file  Tobacco Use   Smoking status: Never   Smokeless tobacco: Never  Vaping Use   Vaping status: Never Used  Substance and Sexual Activity   Alcohol use: Yes    Comment: Only Christmas eve   Drug use: No   Sexual activity: Yes    Birth control/protection: None  Other Topics Concern   Not on file  Social History Narrative   Lives at home w husband and children   Right handed   Caffeine: 2 coke zero a day   No coffee/tea   Social Drivers of Health    Tobacco Use: Low Risk (10/06/2024)   Patient History    Smoking Tobacco Use: Never    Smokeless Tobacco Use: Never    Passive Exposure: Not on file  Financial Resource Strain: Low Risk (10/06/2024)   Overall Financial Resource Strain (CARDIA)    Difficulty of Paying Living Expenses: Not hard at all  Food Insecurity: No Food Insecurity (10/06/2024)   Epic    Worried About Radiation Protection Practitioner of Food in the Last Year: Never true    Ran Out of Food in the Last Year: Never true  Transportation Needs: No Transportation Needs (10/06/2024)   Epic    Lack of Transportation (Medical): No    Lack of Transportation (Non-Medical): No  Physical Activity: Sufficiently Active (10/06/2024)   Exercise Vital Sign    Days of Exercise per Week: 7 days    Minutes of Exercise per Session: 30 min  Stress: No Stress Concern Present (10/06/2024)   [employer] of Occupational Health - Occupational Stress Questionnaire    Feeling of Stress: Not at all  Social Connections: Socially Integrated (10/06/2024)   Social Connection and Isolation Panel    Frequency of Communication with Friends and Family: More than three times a week    Frequency of Social Gatherings with Friends and Family: More than three times a week    Attends Religious Services: More than 4 times per year    Active Member of Clubs or Organizations: Yes    Attends Banker Meetings: More than 4 times per year    Marital Status: Married  Catering Manager Violence: Not on file  Depression (PHQ2-9): Low Risk (10/06/2024)   Depression (PHQ2-9)    PHQ-2 Score: 0  Alcohol Screen: Low Risk (10/06/2024)   Alcohol Screen    Last Alcohol Screening Score (AUDIT): 1  Housing: Low Risk (10/06/2024)   Epic    Unable to Pay for Housing in the Last Year: No    Number of Times Moved in the Last Year: 0    Homeless in the Last Year: No  Utilities: Not on file  Health Literacy: Not on file    Review of Systems  All other systems reviewed and are  negative.       Objective    BP 128/60   Pulse 75   Temp 98.2 F (36.8 C) (Oral)   Ht 5' 4 (1.626 m)   Wt 198 lb 3.2 oz (89.9 kg)   LMP 10/04/2024 (Exact Date)   SpO2 100%   BMI 34.02 kg/m   Physical Exam Vitals reviewed.  Constitutional:      Appearance: Normal appearance. She is well-groomed. She is obese.  Neck:     Thyroid : No thyromegaly.  Cardiovascular:     Rate and Rhythm: Normal rate and  regular rhythm.     Pulses: Normal pulses.     Heart sounds: S1 normal and S2 normal.  Pulmonary:     Effort: Pulmonary effort is normal.     Breath sounds: Normal breath sounds and air entry.  Musculoskeletal:     Right lower leg: No edema.     Left lower leg: No edema.  Neurological:     Mental Status: She is alert and oriented to person, place, and time. Mental status is at baseline.     Gait: Gait is intact.  Psychiatric:        Mood and Affect: Mood and affect normal.        Speech: Speech normal.        Behavior: Behavior normal.        Judgment: Judgment normal.         Assessment & Plan:   Problem List Items Addressed This Visit   None Visit Diagnoses       Class 1 obesity without serious comorbidity with body mass index (BMI) of 34.0 to 34.9 in adult, unspecified obesity type    -  Primary   Relevant Medications   semaglutide -weight management (WEGOVY ) 1.5 MG tablet   Other Relevant Orders   CBC with Differential/Platelet   CMP   Lipid panel   Hemoglobin A1c     Assessment and Plan    Class 1 obesity Current weight of 190 lbs, down from a high of 260 lbs. She has successfully lost weight through diet and exercise, including running marathons and maintaining a calorie-controlled diet. She is interested in oral Wegovy  for further weight loss, as insurance does not cover weight loss medications. She is aware of potential gastrointestinal side effects such as nausea, constipation, and diarrhea, and is prepared to manage these. She is also aware of the  need to monitor blood sugar levels due to the medication's potential to lower blood sugar, especially given her marathon running activities. - Prescribed oral Wegovy  starting at 1.5 mg, to be taken on an empty stomach with 4 ounces of water, and not to eat or drink for 30 minutes after taking the dose. - Advised to use GoodRx or Novo Nordisk website for obtaining the medication at a public service enterprise group. - Instructed to monitor for gastrointestinal side effects and manage with Miralax, Dulcolax, Milk of Magnesia, or Imodium as needed. - Recommended staying hydrated and taking a multivitamin to prevent deficiencies. - Advised to monitor blood sugar levels, especially during marathon runs, and consider using a glucose monitor. - Scheduled follow-up in 3 months to assess progress and adjust medication dosage as needed.  General health maintenance She is due for routine blood work as her last labs were done in August 2024. Previous labs were normal, including thyroid  function, blood counts, cholesterol, A1c, and vitamin D  levels. - Ordered comprehensive blood panel including liver function, kidney function, electrolytes, cholesterol, A1c, and blood counts.        Return in about 3 months (around 01/04/2025) for weight loss, annual physical exam.   Heron CHRISTELLA Sharper, MD   "

## 2024-10-07 ENCOUNTER — Other Ambulatory Visit (HOSPITAL_BASED_OUTPATIENT_CLINIC_OR_DEPARTMENT_OTHER): Payer: Self-pay

## 2024-10-07 ENCOUNTER — Ambulatory Visit: Payer: Self-pay | Admitting: Family Medicine

## 2025-01-05 ENCOUNTER — Encounter: Admitting: Family Medicine
